# Patient Record
Sex: Male | Born: 1965 | Race: Black or African American | Hispanic: No | Marital: Married | Smoking: Never smoker
Health system: Southern US, Community
[De-identification: ages and names within clinical notes are randomized; demographics above are authoritative.]

## PROBLEM LIST (undated history)

## (undated) DIAGNOSIS — J45909 Unspecified asthma, uncomplicated: Secondary | ICD-10-CM

## (undated) DIAGNOSIS — K2970 Gastritis, unspecified, without bleeding: Secondary | ICD-10-CM

## (undated) DIAGNOSIS — C499 Malignant neoplasm of connective and soft tissue, unspecified: Secondary | ICD-10-CM

## (undated) DIAGNOSIS — E785 Hyperlipidemia, unspecified: Secondary | ICD-10-CM

## (undated) DIAGNOSIS — K219 Gastro-esophageal reflux disease without esophagitis: Secondary | ICD-10-CM

## (undated) DIAGNOSIS — E7800 Pure hypercholesterolemia, unspecified: Secondary | ICD-10-CM

## (undated) DIAGNOSIS — E669 Obesity, unspecified: Secondary | ICD-10-CM

## (undated) DIAGNOSIS — R000 Tachycardia, unspecified: Secondary | ICD-10-CM

## (undated) DIAGNOSIS — C801 Malignant (primary) neoplasm, unspecified: Secondary | ICD-10-CM

## (undated) DIAGNOSIS — G4730 Sleep apnea, unspecified: Secondary | ICD-10-CM

## (undated) HISTORY — PX: UVULOPALATOPHARYNGOPLASTY: SHX827

## (undated) HISTORY — DX: Hyperlipidemia, unspecified: E78.5

## (undated) HISTORY — DX: Obesity, unspecified: E66.9

## (undated) HISTORY — PX: TONSILLECTOMY AND ADENOIDECTOMY: SUR1326

## (undated) HISTORY — DX: Malignant neoplasm of connective and soft tissue, unspecified: C49.9

## (undated) HISTORY — DX: Tachycardia, unspecified: R00.0

## (undated) HISTORY — PX: SINUS EXPLORATION: SHX5214

## (undated) HISTORY — DX: Sleep apnea, unspecified: G47.30

## (undated) HISTORY — DX: Gastritis, unspecified, without bleeding: K29.70

---

## 2007-01-17 ENCOUNTER — Inpatient Hospital Stay: Payer: Self-pay | Admitting: Otolaryngology

## 2007-02-04 ENCOUNTER — Emergency Department: Payer: Self-pay | Admitting: Emergency Medicine

## 2009-01-31 ENCOUNTER — Ambulatory Visit: Payer: Self-pay | Admitting: Family Medicine

## 2010-07-13 ENCOUNTER — Ambulatory Visit: Payer: Self-pay | Admitting: Gastroenterology

## 2010-07-16 LAB — PATHOLOGY REPORT

## 2010-10-18 HISTORY — PX: ROTATOR CUFF REPAIR: SHX139

## 2011-02-11 ENCOUNTER — Ambulatory Visit: Payer: Self-pay | Admitting: Family Medicine

## 2011-07-06 ENCOUNTER — Ambulatory Visit: Payer: Self-pay | Admitting: Orthopedic Surgery

## 2011-08-30 ENCOUNTER — Ambulatory Visit: Payer: Self-pay | Admitting: Orthopedic Surgery

## 2011-10-19 HISTORY — PX: ESOPHAGOGASTRODUODENOSCOPY ENDOSCOPY: SHX5814

## 2014-12-18 ENCOUNTER — Ambulatory Visit: Payer: Self-pay | Admitting: Family Medicine

## 2015-04-24 ENCOUNTER — Encounter: Payer: Self-pay | Admitting: Family Medicine

## 2015-04-24 ENCOUNTER — Ambulatory Visit (INDEPENDENT_AMBULATORY_CARE_PROVIDER_SITE_OTHER): Payer: Managed Care, Other (non HMO) | Admitting: Family Medicine

## 2015-04-24 VITALS — BP 108/76 | HR 97 | Temp 98.2°F | Resp 18 | Ht 67.0 in | Wt 227.5 lb

## 2015-04-24 DIAGNOSIS — M5030 Other cervical disc degeneration, unspecified cervical region: Secondary | ICD-10-CM

## 2015-04-24 DIAGNOSIS — J4520 Mild intermittent asthma, uncomplicated: Secondary | ICD-10-CM

## 2015-04-24 NOTE — Patient Instructions (Signed)

## 2015-04-24 NOTE — Progress Notes (Signed)
Name: Dennis Donaldson   MRN: [id number]    DOB: 05-31-1966   Date:04/24/2015       Progress Note  Subjective  Chief Complaint  Chief Complaint  Patient presents with  . Neck Pain     follow up    Neck Pain  This is a recurrent problem. The current episode started more than 1 month ago. The problem occurs intermittently. The problem has been waxing and waning. The pain is associated with a sleep position. The pain is present in the left side. The quality of the pain is described as aching. The pain is mild. The symptoms are aggravated by twisting, position and stress. The pain is worse during the night. Stiffness is present at night. Associated symptoms include numbness and tingling. Pertinent negatives include no fever. He has tried NSAIDs and muscle relaxants for the symptoms. The treatment provided moderate relief.  Breathing Problem He complains of difficulty breathing, shortness of breath and wheezing. This is a recurrent problem. The current episode started more than 1 year ago. The problem occurs intermittently. The problem has been waxing and waning. Pertinent negatives include no fever. His symptoms are aggravated by change in weather, strenuous activity and occupational exposure. His symptoms are alleviated by steroid inhaler and beta-agonist. He reports moderate improvement on treatment. Risk factors for lung disease include occupational exposure. His past medical history is significant for asthma.      History reviewed. No pertinent past medical history.  History  Substance Use Topics  . Smoking status: Never Smoker   . Smokeless tobacco: Never Used  . Alcohol Use: No     Current outpatient prescriptions:  .  albuterol (PROAIR HFA) 108 (90 BASE) MCG/ACT inhaler, Inhale into the lungs., Disp: , Rfl:  .  cyclobenzaprine (FLEXERIL) 5 MG tablet, Take 5 mg by mouth 2 (two) times daily., Disp: , Rfl:  .  meloxicam (MOBIC) 15 MG tablet, Take 15 mg by mouth daily., Disp: , Rfl:  1  Allergies  Allergen Reactions  . Erythromycin     Other reaction(s): Other (See Comments) Other Reaction: GI Upset    Review of Systems  Constitutional: Negative for fever and chills.  Respiratory: Positive for shortness of breath and wheezing.   Cardiovascular: Negative.   Musculoskeletal: Positive for joint pain and neck pain.  Neurological: Positive for tingling and numbness.     Objective  Filed Vitals:   04/24/15 0847  BP: 108/76  Pulse: 97  Temp: 98.2 F (36.8 C)  Resp: 18  Height: 5\' 7"  (1.702 m)  Weight: 227 lb 8 oz (103.193 kg)  SpO2: 98%     Physical Exam  Constitutional: He is oriented to person, place, and time and well-developed, well-nourished, and in no distress.  HENT:  Head: Normocephalic.  Eyes: EOM are normal. Pupils are equal, round, and reactive to light.  Neck: Neck supple. No thyromegaly present.  Lateral rotation of the C-spine limited to about 75. There is also some tenderness in the sternocleidomastoid as well as trapezius area.  Cardiovascular: Normal rate, regular rhythm and normal heart sounds.   No murmur heard. Pulmonary/Chest: Effort normal and breath sounds normal. No respiratory distress. He has no wheezes.  Abdominal: Soft. Bowel sounds are normal.  Musculoskeletal: Normal range of motion. He exhibits no edema.  Lymphadenopathy:    He has no cervical adenopathy.  Neurological: He is alert and oriented to person, place, and time. No cranial nerve deficit. Gait normal. Coordination normal.  Skin: Skin is warm  and dry. No rash noted.  Psychiatric: Affect and judgment normal.      Assessment & Plan   1. Degenerative disc disease, cervical Modestly improved stable - meloxicam (MOBIC) 15 MG tablet; Take 15 mg by mouth daily.; Refill: 1 - cyclobenzaprine (FLEXERIL) 5 MG tablet; Take 5 mg by mouth 2 (two) times daily.  2. Asthma, mild intermittent, uncomplicated Stable - albuterol (PROAIR HFA) 108 (90 BASE) MCG/ACT  inhaler; Inhale into the lungs.

## 2015-07-01 ENCOUNTER — Encounter: Payer: Self-pay | Admitting: Family Medicine

## 2015-07-01 ENCOUNTER — Ambulatory Visit (INDEPENDENT_AMBULATORY_CARE_PROVIDER_SITE_OTHER): Payer: Managed Care, Other (non HMO) | Admitting: Family Medicine

## 2015-07-01 VITALS — BP 118/78 | HR 100 | Temp 98.4°F | Resp 16 | Wt 225.6 lb

## 2015-07-01 DIAGNOSIS — J4520 Mild intermittent asthma, uncomplicated: Secondary | ICD-10-CM

## 2015-07-01 DIAGNOSIS — K219 Gastro-esophageal reflux disease without esophagitis: Secondary | ICD-10-CM | POA: Diagnosis not present

## 2015-07-01 DIAGNOSIS — E669 Obesity, unspecified: Secondary | ICD-10-CM | POA: Diagnosis not present

## 2015-07-01 DIAGNOSIS — Z23 Encounter for immunization: Secondary | ICD-10-CM

## 2015-07-01 DIAGNOSIS — K429 Umbilical hernia without obstruction or gangrene: Secondary | ICD-10-CM

## 2015-07-01 DIAGNOSIS — M5030 Other cervical disc degeneration, unspecified cervical region: Secondary | ICD-10-CM | POA: Diagnosis not present

## 2015-07-01 DIAGNOSIS — J45909 Unspecified asthma, uncomplicated: Secondary | ICD-10-CM

## 2015-07-01 MED ORDER — MELOXICAM 15 MG PO TABS: 15.0000 mg | ORAL_TABLET | Freq: Every day | ORAL | Status: DC

## 2015-07-01 MED ORDER — ALBUTEROL SULFATE HFA 108 (90 BASE) MCG/ACT IN AERS: 2.0000 | INHALATION_SPRAY | Freq: Four times a day (QID) | RESPIRATORY_TRACT | Status: AC | PRN

## 2015-07-01 MED ORDER — OMEPRAZOLE 20 MG PO CPDR: 20.0000 mg | DELAYED_RELEASE_CAPSULE | Freq: Every day | ORAL | Status: DC

## 2015-07-01 NOTE — Progress Notes (Signed)
Name: Dennis Donaldson   MRN: [id number]    DOB: 08-Feb-1966   Date:07/01/2015       Progress Note  Subjective  Chief Complaint  Chief Complaint  Patient presents with  . Neck Pain    pt here for med refills    Neck Pain  Pertinent negatives include no chest pain, fever, headaches, tingling, weakness or weight loss.  Asthma He complains of cough and wheezing. There is no hemoptysis or shortness of breath. Associated symptoms include heartburn. Pertinent negatives include no chest pain, fever, headaches, myalgias, sore throat or weight loss. His past medical history is significant for asthma.  Gastrophageal Reflux He complains of coughing, heartburn and wheezing. He reports no chest pain, no nausea or no sore throat. Pertinent negatives include no weight loss.    Patient has a history of asthma dating back for over 10 years. He has intermittent difficulty with breathing and wheezing and minimal shortness of breath. This has been a chronic and recurrent problem. The onset was many years ago. It occurs intermittently intermittently. Progression has been gradually improving. Associated symptoms include postnasal drip occasionally is aggravated by weather changes and pollen exposure. Treatment consist of Qvar 80 twice a day and pro-air HFA when necessary . Lung disease risk include asthma only . Past medical history is positive for asthma and  nothing nothing .      Obesity  Patient has a history of obesity for several years.  Attempts at weight loss have included diet and exercise and limited to diet compliance .  Results of this regimen  have been relatively successful .  Patient now voices and interest in weight loss by  diet next size  Gastroesophageal reflux history of present illness  Patient has a history of reflux for number of years. Primary symptoms include belching dysphagia heartburn. It has been recurrent over the years. Onset was more than a year ago. It recurs occasionally.  Progression since onset has been gradually worsening. It is aggravated by caffeine and certain foods and tight clothing and weight gain. Associated symptoms include heartburn.  Risk factors include caffeine usage back of exercise obesity and occasional NSAID usage. Treatment tried includes diet change and over-the-counter antacids. This been mild relief with this. There've been no procedures no invasive treatments  Umbilical hernia  Patient complains of pain and discomfort in the umbilical area with some bulging. This is been a recurrent problem and is worse with weight gain and with straining.   Social History  Substance Use Topics  . Smoking status: Never Smoker   . Smokeless tobacco: Never Used  . Alcohol Use: No     Current outpatient prescriptions:  .  albuterol (PROAIR HFA) 108 (90 BASE) MCG/ACT inhaler, Inhale 2 puffs into the lungs every 6 (six) hours as needed for wheezing or shortness of breath., Disp: 1 Inhaler, Rfl: 3 .  meloxicam (MOBIC) 15 MG tablet, Take 1 tablet (15 mg total) by mouth daily., Disp: 90 tablet, Rfl: 1 .  cyclobenzaprine (FLEXERIL) 5 MG tablet, Take 5 mg by mouth 2 (two) times daily., Disp: , Rfl:  .  omeprazole (PRILOSEC) 20 MG capsule, Take 1 capsule (20 mg total) by mouth daily., Disp: 90 capsule, Rfl: 1  Allergies  Allergen Reactions  . Erythromycin     Other reaction(s): Other (See Comments) Other Reaction: GI Upset    Review of Systems  Constitutional: Negative for fever, chills and weight loss.       Modest obese in no  acute distress  HENT: Negative for congestion, hearing loss, sore throat and tinnitus.   Eyes: Negative for blurred vision, double vision and redness.  Respiratory: Positive for cough and wheezing. Negative for hemoptysis and shortness of breath.   Cardiovascular: Negative for chest pain, palpitations, orthopnea, claudication and leg swelling.  Gastrointestinal: Positive for heartburn. Negative for nausea, vomiting, diarrhea,  constipation and blood in stool.  Genitourinary: Negative for dysuria, urgency, frequency and hematuria.  Musculoskeletal: Positive for neck pain. Negative for myalgias, back pain, joint pain and falls.  Skin: Negative for itching.  Neurological: Negative for dizziness, tingling, tremors, focal weakness, seizures, loss of consciousness, weakness and headaches.  Endo/Heme/Allergies: Does not bruise/bleed easily.  Psychiatric/Behavioral: Negative for depression and substance abuse. The patient is not nervous/anxious and does not have insomnia.      Objective  Filed Vitals:   07/01/15 0912  BP: 118/78  Pulse: 100  Temp: 98.4 F (36.9 C)  Resp: 16  Weight: 225 lb 9 oz (102.314 kg)  SpO2: 98%     Physical Exam  Constitutional: He is oriented to person, place, and time and well-developed, well-nourished, and in no distress.  Modestly obese in no acute distress  HENT:  Head: Normocephalic.  Eyes: EOM are normal. Pupils are equal, round, and reactive to light.  Neck: Normal range of motion. Neck supple. No thyromegaly present.  Cardiovascular: Normal rate, regular rhythm and normal heart sounds.   No murmur heard. Pulmonary/Chest: Effort normal and breath sounds normal. No respiratory distress. He has no wheezes.  Abdominal: Soft. Bowel sounds are normal. There is tenderness.  Obese  Umbilical hernia is palpable with tenderness but is freely reducible  Musculoskeletal: Normal range of motion. He exhibits no edema.  Lymphadenopathy:    He has no cervical adenopathy.  Neurological: He is alert and oriented to person, place, and time. No cranial nerve deficit. Gait normal. Coordination normal.  Skin: Skin is warm and dry. No rash noted.  Psychiatric: Mood, affect and judgment normal.      Assessment & Plan  1. Asthma, unspecified asthma severity, uncomplicated Stable  2. Obesity Handout given  3. Gastroesophageal reflux disease, esophagitis presence not  specified Omeprazole - omeprazole (PRILOSEC) 20 MG capsule; Take 1 capsule (20 mg total) by mouth daily.  Dispense: 90 capsule; Refill: 1  4. Umbilical hernia, recurrence not specified Conservative management for now but will eventually need to consider elective surgery  5. Need for influenza vaccination Given - Flu Vaccine QUAD 36+ mos PF IM (Fluarix & Fluzone Quad PF)  6. Need for pneumococcal vaccination Given - Pneumococcal conjugate vaccine 13-valent  7. Degenerative disc disease, cervical Continue meloxicam - meloxicam (MOBIC) 15 MG tablet; Take 1 tablet (15 mg total) by mouth daily.  Dispense: 90 tablet; Refill: 1  8. Asthma, mild intermittent, uncomplicated Continue Qvar daily and albuterol when necessary - albuterol (PROAIR HFA) 108 (90 BASE) MCG/ACT inhaler; Inhale 2 puffs into the lungs every 6 (six) hours as needed for wheezing or shortness of breath.  Dispense: 1 Inhaler; Refill: 3

## 2015-07-01 NOTE — Patient Instructions (Signed)

## 2015-07-02 ENCOUNTER — Other Ambulatory Visit: Payer: Self-pay | Admitting: Emergency Medicine

## 2015-07-03 ENCOUNTER — Telehealth: Payer: Self-pay | Admitting: Family Medicine

## 2015-07-03 MED ORDER — BECLOMETHASONE DIPROPIONATE 80 MCG/ACT IN AERS: 2.0000 | INHALATION_SPRAY | Freq: Four times a day (QID) | RESPIRATORY_TRACT | Status: DC

## 2015-07-03 MED ORDER — BECLOMETHASONE DIPROPIONATE 80 MCG/ACT IN AERS: 2.0000 | INHALATION_SPRAY | Freq: Two times a day (BID) | RESPIRATORY_TRACT | Status: DC

## 2015-07-03 NOTE — Telephone Encounter (Signed)
Spoke with [facility] and new rx has been sent

## 2015-07-03 NOTE — Telephone Encounter (Signed)
Kasey from [facility] requesting clarification on the dosage of Qvar. States that the dosage given is only allowing the patient a 30 day supply verses 90 day supply.  [phone]

## 2015-07-04 ENCOUNTER — Other Ambulatory Visit: Payer: Self-pay | Admitting: Emergency Medicine

## 2015-07-04 MED ORDER — BECLOMETHASONE DIPROPIONATE 80 MCG/ACT IN AERS: 2.0000 | INHALATION_SPRAY | Freq: Two times a day (BID) | RESPIRATORY_TRACT | Status: AC

## 2015-07-10 ENCOUNTER — Encounter: Payer: Self-pay | Admitting: Family Medicine

## 2015-07-10 ENCOUNTER — Ambulatory Visit (INDEPENDENT_AMBULATORY_CARE_PROVIDER_SITE_OTHER): Payer: Managed Care, Other (non HMO) | Admitting: Family Medicine

## 2015-07-10 ENCOUNTER — Other Ambulatory Visit: Payer: Self-pay

## 2015-07-10 ENCOUNTER — Ambulatory Visit: Payer: Managed Care, Other (non HMO) | Admitting: Family Medicine

## 2015-07-10 ENCOUNTER — Emergency Department: Payer: Managed Care, Other (non HMO)

## 2015-07-10 ENCOUNTER — Encounter: Payer: Self-pay | Admitting: Emergency Medicine

## 2015-07-10 ENCOUNTER — Emergency Department
Admission: EM | Admit: 2015-07-10 | Discharge: 2015-07-10 | Disposition: A | Discharge status: Home / Self Care | Payer: Managed Care, Other (non HMO) | Attending: Emergency Medicine | Admitting: Emergency Medicine

## 2015-07-10 VITALS — BP 118/84 | HR 100 | Temp 98.6°F | Resp 16 | Ht 67.0 in | Wt 226.1 lb

## 2015-07-10 DIAGNOSIS — R079 Chest pain, unspecified: Secondary | ICD-10-CM

## 2015-07-10 DIAGNOSIS — Z791 Long term (current) use of non-steroidal anti-inflammatories (NSAID): Secondary | ICD-10-CM | POA: Insufficient documentation

## 2015-07-10 DIAGNOSIS — Z7951 Long term (current) use of inhaled steroids: Secondary | ICD-10-CM | POA: Diagnosis not present

## 2015-07-10 DIAGNOSIS — Z79899 Other long term (current) drug therapy: Secondary | ICD-10-CM | POA: Diagnosis not present

## 2015-07-10 DIAGNOSIS — R42 Dizziness and giddiness: Secondary | ICD-10-CM | POA: Diagnosis not present

## 2015-07-10 HISTORY — DX: Pure hypercholesterolemia, unspecified: E78.00

## 2015-07-10 HISTORY — DX: Gastro-esophageal reflux disease without esophagitis: K21.9

## 2015-07-10 HISTORY — DX: Malignant (primary) neoplasm, unspecified: C80.1

## 2015-07-10 HISTORY — DX: Unspecified asthma, uncomplicated: J45.909

## 2015-07-10 LAB — CBC
HEMATOCRIT: 47.9 % (ref 40.0–52.0)
HEMOGLOBIN: 15.8 g/dL (ref 13.0–18.0)
MCH: 29.9 pg (ref 26.0–34.0)
MCHC: 33.1 g/dL (ref 32.0–36.0)
MCV: 90.5 fL (ref 80.0–100.0)
Platelets: 154 10*3/uL (ref 150–440)
RBC: 5.29 MIL/uL (ref 4.40–5.90)
RDW: 13.7 % (ref 11.5–14.5)
WBC: 4.9 10*3/uL (ref 3.8–10.6)

## 2015-07-10 LAB — BASIC METABOLIC PANEL
ANION GAP: 9 (ref 5–15)
BUN: 16 mg/dL (ref 6–20)
CALCIUM: 9.6 mg/dL (ref 8.9–10.3)
CO2: 28 mmol/L (ref 22–32)
Chloride: 103 mmol/L (ref 101–111)
Creatinine, Ser: 1.29 mg/dL — ABNORMAL HIGH (ref 0.61–1.24)
GFR calc Af Amer: >60 mL/min (ref >60)
GFR calc non Af Amer: >60 mL/min (ref >60)
GLUCOSE: 91 mg/dL (ref 65–99)
POTASSIUM: 4.2 mmol/L (ref 3.5–5.1)
Sodium: 140 mmol/L (ref 135–145)

## 2015-07-10 LAB — TROPONIN I: Troponin I: 0.03 ng/mL (ref <0.031)

## 2015-07-10 NOTE — ED Notes (Signed)
Chest pain for 3 days that comes and goes.  Says it was not that bad this am, but it seems like it gets worse through the dayl.

## 2015-07-10 NOTE — ED Provider Notes (Signed)
[facility] Emergency Department Provider Note  Time seen: 10:36 AM  I have reviewed the triage vital signs and the nursing notes.   HISTORY  Chief Complaint Chest Pain and Dizziness    HPI Dennis Donaldson is a 49 y.o. male with a past medical history of asthma, gastric reflux, hyperlipidemia, osteosarcoma in remission, who presents to the emergency department with intermittent chest pain. According to the patient for the past 3-4 days he has had intermittent chest pains. He states they are mild in the morning was somewhat worse and as the day progresses. Denies any acute correlation with exertion, but states the longer he is up and about the worst the pain gets. Denies any resolution of pain with rest. Denies any nausea or trouble breathing. Patient has no cardiac history himself, or any direct family cardiac history. Describes his chest pain currently is mild, dull sensation throughout his entire chest.    Past Medical History  Diagnosis Date  . Asthma   . GERD (gastroesophageal reflux disease)   . Cancer   . Hypercholesteremia     Patient Active Problem List   Diagnosis Date Noted  . Chest pain, rule out acute myocardial infarction 07/10/2015  . Chest pain 07/10/2015    Past Surgical History  Procedure Laterality Date  . Rotator cuff repair Left 2012  . Uvulopalatopharyngoplasty    . Tonsillectomy and adenoidectomy    . Sinus exploration      Current Outpatient Rx  Name  Route  Sig  Dispense  Refill  . albuterol (PROAIR HFA) 108 (90 BASE) MCG/ACT inhaler   Inhalation   Inhale 2 puffs into the lungs every 6 (six) hours as needed for wheezing or shortness of breath.   1 Inhaler   3   . beclomethasone (QVAR) 80 MCG/ACT inhaler   Inhalation   Inhale 2 puffs into the lungs 2 (two) times daily.   3 Inhaler   1   . cyclobenzaprine (FLEXERIL) 5 MG tablet   Oral   Take 5 mg by mouth 2 (two) times daily.         . meloxicam (MOBIC) 15 MG  tablet   Oral   Take 1 tablet (15 mg total) by mouth daily.   90 tablet   1   . omeprazole (PRILOSEC) 20 MG capsule   Oral   Take 1 capsule (20 mg total) by mouth daily. Patient not taking: Reported on 07/10/2015   90 capsule   1     Allergies Erythromycin  Family History  Problem Relation Age of Onset  . Thyroid disease Mother   . Hypertension Mother   . Diabetes Maternal Grandmother   . Diabetes Maternal Grandfather     Social History Social History  Substance Use Topics  . Smoking status: Never Smoker   . Smokeless tobacco: Never Used  . Alcohol Use: No    Review of Systems Constitutional: Negative for fever Cardiovascular: Positive for chest pain. Respiratory: Negative for shortness of breath. Gastrointestinal: Negative for abdominal pain, vomiting and diarrhea. Neurological: Negative for headache 10-point ROS otherwise negative.  ____________________________________________   PHYSICAL EXAM:  VITAL SIGNS: ED Triage Vitals  Enc Vitals Group     BP 07/10/15 0959 136/90 mmHg     Pulse Rate 07/10/15 0959 85     Resp 07/10/15 0959 18     Temp 07/10/15 0959 98.7 F (37.1 C)     Temp Source 07/10/15 0959 Oral     SpO2 07/10/15  0959 99 %     Weight 07/10/15 0953 226 lb (102.513 kg)     Height 07/10/15 0953 5\' 6"  (1.676 m)     Head Cir --      Peak Flow --      Pain Score 07/10/15 0953 4     Pain Loc --      Pain Edu? --      Excl. in [place]? --     Constitutional: Alert and oriented. Well appearing and in no distress. Eyes: Normal exam ENT   Head: Normocephalic and atraumatic. Cardiovascular: Normal rate, regular rhythm. No murmur Respiratory: Normal respiratory effort without tachypnea nor retractions. Breath sounds are clear and equal bilaterally. No wheezes/rales/rhonchi. Gastrointestinal: Soft and nontender. No distention.   Musculoskeletal: Nontender with normal range of motion in all extremities. No lower extremity tenderness or  edema. Neurologic:  Normal speech and language. No gross focal neurologic deficits  Psychiatric: Mood and affect are normal. Speech and behavior are normal. Patient exhibits appropriate insight and judgment.  ____________________________________________    EKG  EKG reviewed and interpreted by myself shows normal sinus rhythm at 80 bpm, narrow QRS, normal axis, normal intervals, no ST changes present., RSR pattern present, consistent with incomplete right bundle branch block.  ____________________________________________    RADIOLOGY  Chest x-ray shows no acute abnormalities.  ____________________________________________   INITIAL IMPRESSION / ASSESSMENT AND PLAN / ED COURSE  Pertinent labs & imaging results that were available during my care of the patient were reviewed by me and considered in my medical decision making (see chart for details).  Patient with intermittent chest pain for the past 3-4 days. Currently states mild discomfort. We will check labs. Chest x-ray and EKG both appear normal. Labs within normal limits. Troponin is negative. We will refer the patient cardiology for stress test. I discussed this with the patient as well as my normal chest pain return precautions who is agreeable to plan.  ____________________________________________   FINAL CLINICAL IMPRESSION(S) / ED DIAGNOSES  Chest pain   Harvest Dark, MD 07/10/15 1324

## 2015-07-10 NOTE — Discharge Instructions (Signed)

## 2015-07-10 NOTE — Progress Notes (Signed)
Name: Dennis Donaldson   MRN: [id number]    DOB: 30-May-1966   Date:07/10/2015       Progress Note  Subjective  Chief Complaint  Chief Complaint  Patient presents with  . Chest Pain    pt has been having chest tightnees and pain for 7 days but also started medication and believes it may be related  . Dizziness    Chest Pain  This is a new problem. The current episode started in the past 7 days. The problem occurs intermittently. The pain is present in the substernal region. The pain is at a severity of 4/10. The pain is mild. The quality of the pain is described as pressure. The pain does not radiate. Associated symptoms include irregular heartbeat (pt. describes symptoms of 'flutter' in his chest) and near-syncope (experiencing dizziness intermittently). Pertinent negatives include no cough, fever, leg pain, malaise/fatigue, nausea, palpitations, shortness of breath or vomiting.  Pertinent negatives for past medical history include no MI, no PE and no strokes.  Pertinent negatives for family medical history include: no heart disease.  patient was recently started on omeprazole for reflux and thinks that the chest pain may be related to omeprazole, which he has since discontinued.  No past medical history on file.  Past Surgical History  Procedure Laterality Date  . Rotator cuff repair Left 2012  . Uvulopalatopharyngoplasty    . Tonsillectomy and adenoidectomy    . Sinus exploration      Family History  Problem Relation Age of Onset  . Thyroid disease Mother   . Hypertension Mother   . Diabetes Maternal Grandmother   . Diabetes Maternal Grandfather     Social History   Social History  . Marital Status: Married    Spouse Name: N/A  . Number of Children: N/A  . Years of Education: N/A   Occupational History  . Not on file.   Social History Main Topics  . Smoking status: Never Smoker   . Smokeless tobacco: Never Used  . Alcohol Use: No  . Drug Use: No  . Sexual Activity:     Partners: Female   Other Topics Concern  . Not on file   Social History Narrative     Current outpatient prescriptions:  .  albuterol (PROAIR HFA) 108 (90 BASE) MCG/ACT inhaler, Inhale 2 puffs into the lungs every 6 (six) hours as needed for wheezing or shortness of breath., Disp: 1 Inhaler, Rfl: 3 .  beclomethasone (QVAR) 80 MCG/ACT inhaler, Inhale 2 puffs into the lungs 2 (two) times daily., Disp: 3 Inhaler, Rfl: 1 .  cyclobenzaprine (FLEXERIL) 5 MG tablet, Take 5 mg by mouth 2 (two) times daily., Disp: , Rfl:  .  meloxicam (MOBIC) 15 MG tablet, Take 1 tablet (15 mg total) by mouth daily., Disp: 90 tablet, Rfl: 1 .  omeprazole (PRILOSEC) 20 MG capsule, Take 1 capsule (20 mg total) by mouth daily. (Patient not taking: Reported on 07/10/2015), Disp: 90 capsule, Rfl: 1  Allergies  Allergen Reactions  . Erythromycin     Other reaction(s): Other (See Comments) Other Reaction: GI Upset   Review of Systems  Constitutional: Negative for fever and malaise/fatigue.  Respiratory: Negative for cough and shortness of breath.   Cardiovascular: Positive for near-syncope (experiencing dizziness intermittently). Negative for palpitations.  Gastrointestinal: Negative for nausea and vomiting.   Objective  Filed Vitals:   07/10/15 0858  BP: 118/84  Pulse: 100  Temp: 98.6 F (37 C)  Resp: 16  Height: 5\' 7"  (  1.702 m)  Weight: 226 lb 1 oz (102.541 kg)  SpO2: 96%    Physical Exam  Constitutional: He is oriented to person, place, and time and well-developed, well-nourished, and in no distress.  HENT:  Head: Normocephalic and atraumatic.  Cardiovascular: Tachycardia present.   Pulmonary/Chest: Breath sounds normal.  Neurological: He is alert and oriented to person, place, and time.  Skin: Skin is warm and dry.  Nursing note and vitals reviewed.   Assessment & Plan  1. Chest pain, unspecified chest pain type  Referred patient to [facility] ER for evaluation of recurrent chest pain. Plan  discussed with patient and  Informed charge nurse at the ER of patient's arrival.  [name] A. [facility] 07/10/2015 9:16 AM

## 2015-07-10 NOTE — ED Notes (Signed)
MD at bedside. [email or web address]

## 2015-07-14 ENCOUNTER — Ambulatory Visit (INDEPENDENT_AMBULATORY_CARE_PROVIDER_SITE_OTHER): Payer: Managed Care, Other (non HMO) | Admitting: Cardiovascular Disease

## 2015-07-14 ENCOUNTER — Encounter: Payer: Self-pay | Admitting: Cardiovascular Disease

## 2015-07-14 VITALS — BP 122/88 | HR 105 | Ht 66.5 in | Wt 224.5 lb

## 2015-07-14 DIAGNOSIS — E785 Hyperlipidemia, unspecified: Secondary | ICD-10-CM

## 2015-07-14 DIAGNOSIS — R079 Chest pain, unspecified: Secondary | ICD-10-CM

## 2015-07-14 DIAGNOSIS — G4730 Sleep apnea, unspecified: Secondary | ICD-10-CM

## 2015-07-14 DIAGNOSIS — E669 Obesity, unspecified: Secondary | ICD-10-CM

## 2015-07-14 DIAGNOSIS — C499 Malignant neoplasm of connective and soft tissue, unspecified: Secondary | ICD-10-CM

## 2015-07-14 DIAGNOSIS — I471 Supraventricular tachycardia: Secondary | ICD-10-CM | POA: Diagnosis not present

## 2015-07-14 DIAGNOSIS — R000 Tachycardia, unspecified: Secondary | ICD-10-CM

## 2015-07-14 HISTORY — DX: Obesity, unspecified: E66.9

## 2015-07-14 HISTORY — DX: Malignant neoplasm of connective and soft tissue, unspecified: C49.9

## 2015-07-14 HISTORY — DX: Tachycardia, unspecified: R00.0

## 2015-07-14 HISTORY — DX: Sleep apnea, unspecified: G47.30

## 2015-07-14 HISTORY — DX: Hyperlipidemia, unspecified: E78.5

## 2015-07-14 NOTE — Assessment & Plan Note (Signed)
He had phase I/phase II surgery, reports having no apnea but does have snoring Likely unrelated to his present symptoms

## 2015-07-14 NOTE — Assessment & Plan Note (Signed)
Cutaneous lesion treated on his left hand in 2003. No recurrence

## 2015-07-14 NOTE — Patient Instructions (Addendum)
You are doing well. No medication changes were made.  We will schedule a treadmill test for chest pain  If chest pain gets worse, we could schedule an echo  Monitor your heart rate  Please call us if you have new issues that need to be addressed before your next appt.    Exercise Stress Electrocardiogram An exercise stress electrocardiogram is a test that is done to evaluate the blood supply to your heart. This test may also be called exercise stress electrocardiography. The test is done while you are walking on a treadmill. The goal of this test is to raise your heart rate. This test is done to find areas of poor blood flow to the heart by determining the extent of coronary artery disease (CAD).   CAD is defined as narrowing in one or more heart (coronary) arteries of more than 70%. If you have an abnormal test result, this may mean that you are not getting adequate blood flow to your heart during exercise. Additional testing may be needed to understand why your test was abnormal. LET [facility] CARE Dennis Donaldson KNOW ABOUT:   Any allergies you have.  All medicines you are taking, including vitamins, herbs, eye drops, creams, and over-the-counter medicines.  Previous problems you or members of your family have had with the use of anesthetics.  Any blood disorders you have.  Previous surgeries you have had.  Medical conditions you have.  Possibility of pregnancy, if this applies. RISKS AND COMPLICATIONS Generally, this is a safe procedure. However, as with any procedure, complications can occur. Possible complications can include:  Pain or pressure in the following areas:  Chest.  Jaw or neck.  Between your shoulder blades.  Radiating down your left arm.  Dizziness or light-headedness.  Shortness of breath.  Increased or irregular heartbeats.  Nausea or vomiting.  Heart attack (rare). BEFORE THE PROCEDURE  Avoid all forms of caffeine 24 hours before your test or as  directed by your health care Dennis Donaldson. This includes coffee, tea (even decaffeinated tea), caffeinated sodas, chocolate, cocoa, and certain pain medicines.  Follow your health care Dennis Donaldson's instructions regarding eating and drinking before the test.  Take your medicines as directed at regular times with water unless instructed otherwise. Exceptions may include:  If you have diabetes, ask how you are to take your insulin or pills. It is common to adjust insulin dosing the morning of the test.  If you are taking beta-blocker medicines, it is important to talk to your health care Dennis Donaldson about these medicines well before the date of your test. Taking beta-blocker medicines may interfere with the test. In some cases, these medicines need to be changed or stopped 24 hours or more before the test.  If you wear a nitroglycerin patch, it may need to be removed prior to the test. Ask your health care Dennis Donaldson if the patch should be removed before the test.  If you use an inhaler for any breathing condition, bring it with you to the test.  If you are an outpatient, bring a snack so you can eat right after the stress phase of the test.  Do not smoke for 4 hours prior to the test or as directed by your health care Dennis Donaldson.  Do not apply lotions, powders, creams, or oils on your chest prior to the test.  Wear loose-fitting clothes and comfortable shoes for the test. This test involves walking on a treadmill. PROCEDURE  Multiple patches (electrodes) will be put on your chest.  If needed, small areas of your chest may have to be shaved to get better contact with the electrodes. Once the electrodes are attached to your body, multiple wires will be attached to the electrodes and your heart rate will be monitored.  Your heart will be monitored both at rest and while exercising.  You will walk on a treadmill. The treadmill will be started at a slow pace. The treadmill speed and incline will gradually be  increased to raise your heart rate. AFTER THE PROCEDURE  Your heart rate and blood pressure will be monitored after the test.  You may return to your normal schedule including diet, activities, and medicines, unless your health care Dennis Donaldson tells you otherwise. Document Released: 10/01/2000 Document Revised: 10/09/2013 Document Reviewed: 06/11/2013 [facility] Patient Information 2015 [place], [place]. This information is not intended to replace advice given to you by your health care Dennis Donaldson. Make sure you discuss any questions you have with your health care Dennis Donaldson.

## 2015-07-14 NOTE — Assessment & Plan Note (Signed)
Etiology of his sinus tachycardia is unclear. Prior EKG and Dr. visits showing heart rate in the 80s. Unable to exclude anxiety We'll monitor for now

## 2015-07-14 NOTE — Assessment & Plan Note (Signed)
Currently not on a cholesterol medication. He is trying to lose weight, changes diet

## 2015-07-14 NOTE — Progress Notes (Signed)
Patient ID: Dennis Donaldson, male    DOB: January 26, 1966, 49 y.o.   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 He is a long history of GERD.  He reports that he recently started omeprazole, one week later developed difficulty when he swallowed like something was stuck in his mediastinal area. Symptoms persisted for many days. Symptoms were present at rest and stress, no change on exertion. Did not seem to change with position. He stopped his omeprazole, GERD is now back. Chest pressure in his mediastinum still persists but very mild.  He is otherwise active. No complaints at baseline No prior smoking history. No family history of coronary artery disease Had a treadmill several years ago for chest pain symptoms which was normal. Details not available. He feels this was done at [place]. Since he developed chest discomfort, he has been monitoring blood pressure and heart rate. Heart rate has been running high at 100 sometimes 115 bpm  EKG on today's visit shows sinus tachycardia with rate 105 bpm   Allergies  Allergen Reactions  . Erythromycin     Other reaction(s): Other (See Comments) Other Reaction: GI Upset    Current Outpatient Prescriptions on File Prior to Visit  Medication Sig Dispense Refill  . albuterol (PROAIR HFA) 108 (90 BASE) MCG/ACT inhaler Inhale 2 puffs into the lungs every 6 (six) hours as needed for wheezing or shortness of breath. 1 Inhaler 3  . beclomethasone (QVAR) 80 MCG/ACT inhaler Inhale 2 puffs into the lungs 2 (two) times daily. 3 Inhaler 1  . cyclobenzaprine (FLEXERIL) 5 MG tablet Take 5 mg by mouth 2 (two) times daily.    . meloxicam (MOBIC) 15 MG tablet Take 1 tablet (15 mg total) by mouth daily. 90 tablet 1  . omeprazole (PRILOSEC) 20  MG capsule Take 1 capsule (20 mg total) by mouth daily. 90 capsule 1   No current facility-administered medications on file prior to visit.    Past Medical History  Diagnosis Date  . Asthma   . GERD (gastroesophageal reflux disease)   . Cancer   . Hypercholesteremia     Past Surgical History  Procedure Laterality Date  . Rotator cuff repair Left 2012  . Uvulopalatopharyngoplasty    . Tonsillectomy and adenoidectomy    . Sinus exploration      Social History  reports that he has never smoked. He has never used smokeless tobacco. He reports that he does not drink alcohol or use illicit drugs.  Family History family history includes Diabetes in his maternal grandfather and maternal grandmother; Hypertension in his mother; Thyroid disease in his mother.   Review of Systems  Constitutional: Negative.   HENT: Negative.   Respiratory: Negative.   Cardiovascular: Positive for chest pain.       Tachycardia  Gastrointestinal: Negative.   Musculoskeletal: Negative.   Neurological: Negative.   Hematological: Negative.   Psychiatric/Behavioral: Negative.   All other systems reviewed and are negative.   BP 122/88 mmHg  Pulse 105  Ht 5' 6.5" (1.689 m)  Wt 224 lb 8 oz (101.833 kg)  BMI 35.70 kg/m2  Physical Exam  Constitutional: He is oriented to person, place, and time. He appears well-developed and well-nourished.  HENT:  Head: Normocephalic.  Nose: Nose normal.  Mouth/Throat: Oropharynx is clear and moist.  Eyes: Conjunctivae are normal. Pupils are equal, round, and reactive to light.  Neck: Normal range of motion. Neck supple. No JVD present.  Cardiovascular: Normal rate, regular rhythm, normal heart sounds and intact distal pulses.  Exam reveals no gallop and no friction rub.   No murmur heard. Pulmonary/Chest: Effort normal and breath sounds normal. No respiratory distress. He has no wheezes. He has no rales. He exhibits no tenderness.  Abdominal: Soft. Bowel sounds  are normal. He exhibits no distension. There is no tenderness.  Musculoskeletal: Normal range of motion. He exhibits no edema or tenderness.  Lymphadenopathy:    He has no cervical adenopathy.  Neurological: He is alert and oriented to person, place, and time. Coordination normal.  Skin: Skin is warm and dry. No rash noted. No erythema.  Psychiatric: He has a normal mood and affect. His behavior is normal. Judgment and thought content normal.

## 2015-07-14 NOTE — Assessment & Plan Note (Addendum)
Atypical chest pain symptoms Less likely musculoskeletal, unable to exclude GI etiology. We'll start with routine treadmill study If this shows no EKG changes concerning for ischemia and if he has recurrent symptoms, could order echocardiogram to rule out structural heart disease. This was discussed with him. If he would like echocardiogram, this can be scheduled at any time

## 2015-07-14 NOTE — Assessment & Plan Note (Signed)
We have encouraged continued exercise, careful diet management in an effort to lose weight. 

## 2015-07-31 ENCOUNTER — Ambulatory Visit (INDEPENDENT_AMBULATORY_CARE_PROVIDER_SITE_OTHER): Payer: Managed Care, Other (non HMO)

## 2015-07-31 DIAGNOSIS — R079 Chest pain, unspecified: Secondary | ICD-10-CM | POA: Diagnosis not present

## 2015-08-01 LAB — EXERCISE TOLERANCE TEST
Estimated workload: 9.3 METS
Exercise duration (min): 7 min
Exercise duration (sec): 24 s
MPHR: 172 {beats}/min
Peak HR: 153 {beats}/min
Percent HR: 88 %
Percent of predicted max HR: 88 %
Rest HR: 93 {beats}/min
Stage 1 DBP: 96 mmHg
Stage 1 Grade: 0 %
Stage 1 HR: 96 {beats}/min
Stage 1 SBP: 169 mmHg
Stage 1 Speed: 0 mph
Stage 2 Grade: 0.1 %
Stage 2 HR: 96 {beats}/min
Stage 2 Speed: 0 mph
Stage 3 Grade: 10 %
Stage 3 HR: 120 {beats}/min
Stage 3 Speed: 1.7 mph
Stage 4 DBP: 72 mmHg
Stage 4 Grade: 12 %
Stage 4 HR: 137 {beats}/min
Stage 4 SBP: 169 mmHg
Stage 4 Speed: 2.5 mph
Stage 5 Grade: 14 %
Stage 5 HR: 153 {beats}/min
Stage 5 Speed: 3.5 mph
Stage 6 Grade: 0 %
Stage 6 HR: 144 {beats}/min
Stage 6 Speed: 0 mph
Stage 7 DBP: 85 mmHg
Stage 7 Grade: 0 %
Stage 7 HR: 104 {beats}/min
Stage 7 SBP: 152 mmHg
Stage 7 Speed: 0 mph

## 2015-08-25 ENCOUNTER — Telehealth: Payer: Self-pay | Admitting: Family Medicine

## 2015-08-25 NOTE — Telephone Encounter (Signed)
PT SAID THAT HE WANTED YOU TO NOW THAT HE IS HAVING SOME DIGESTIVE ISSUE AND I MADE HIM AN APPT FOR Thursday THE 10TH BUT HE WANTED ME TO PLACE A MESSAGE IN HERE ALSO. WANTS TO KNOW IF HE MAY NEED TO GO SOMEWHERE ELSE SOONER SINCE WE DO NOT HAVE ANYTHING TILL Thursday THE 10TH.

## 2015-08-26 NOTE — Telephone Encounter (Signed)
If he feels he cannot wait until then, go to urgent care

## 2015-08-28 ENCOUNTER — Ambulatory Visit: Payer: Managed Care, Other (non HMO) | Admitting: Family Medicine

## 2015-08-28 ENCOUNTER — Ambulatory Visit (INDEPENDENT_AMBULATORY_CARE_PROVIDER_SITE_OTHER): Payer: Managed Care, Other (non HMO) | Admitting: Family Medicine

## 2015-08-28 ENCOUNTER — Encounter: Payer: Self-pay | Admitting: Family Medicine

## 2015-08-28 VITALS — BP 136/82 | HR 116 | Temp 98.1°F | Resp 16 | Ht 67.0 in | Wt 219.9 lb

## 2015-08-28 DIAGNOSIS — R1013 Epigastric pain: Secondary | ICD-10-CM

## 2015-08-28 MED ORDER — PANTOPRAZOLE SODIUM 40 MG PO TBEC: 40.0000 mg | DELAYED_RELEASE_TABLET | Freq: Every day | ORAL | Status: DC

## 2015-08-28 NOTE — Telephone Encounter (Signed)
Patient has appointment to come in to be seen

## 2015-08-28 NOTE — Progress Notes (Signed)
Name: Dennis Donaldson   MRN: [id number]    DOB: Jul 05, 1966   Date:08/28/2015       Progress Note  Subjective  Chief Complaint  Chief Complaint  Patient presents with  . Gastroesophageal Reflux    patient recently finished taking a round of  Omeprazole 20mg . patient presents with burning, epigastric pressure and right upper quadrant pain. patient also reports of belching and some nausea but no vomitting.    HPI  Pt. Presents with upper abdominal discomfort since Saturday 08/23/15. Symptoms include epigastric, LUQ, and RUQ discomfort described as being 'full'. Discomfort present all the time, unrelated to meals. He has been feeling nauseous lately but no vomiting. Describes loss of appetite and thinks may have lost 2-3 lbs since last weekend. and noticed a light pale-colored stool this past weekend. He has been on Omeprazole in the past for symptoms of heartburn and reflux, and started taking Omeprazole again. He finished 30-days of Omeprazole which helped his reflux.   Past Medical History  Diagnosis Date  . Asthma   . GERD (gastroesophageal reflux disease)   . Cancer ([place])   . Hypercholesteremia     Past Surgical History  Procedure Laterality Date  . Rotator cuff repair Left 2012  . Uvulopalatopharyngoplasty    . Tonsillectomy and adenoidectomy    . Sinus exploration      Family History  Problem Relation Age of Onset  . Thyroid disease Mother   . Hypertension Mother   . Diabetes Maternal Grandmother   . Diabetes Maternal Grandfather     Social History   Social History  . Marital Status: Married    Spouse Name: N/A  . Number of Children: N/A  . Years of Education: N/A   Occupational History  . Not on file.   Social History Main Topics  . Smoking status: Never Smoker   . Smokeless tobacco: Never Used  . Alcohol Use: No  . Drug Use: No  . Sexual Activity:    Partners: Female   Other Topics Concern  . Not on file   Social History Narrative     Current  outpatient prescriptions:  .  albuterol (PROAIR HFA) 108 (90 BASE) MCG/ACT inhaler, Inhale 2 puffs into the lungs every 6 (six) hours as needed for wheezing or shortness of breath., Disp: 1 Inhaler, Rfl: 3 .  beclomethasone (QVAR) 80 MCG/ACT inhaler, Inhale 2 puffs into the lungs 2 (two) times daily., Disp: 3 Inhaler, Rfl: 1 .  omeprazole (PRILOSEC) 20 MG capsule, Take 1 capsule (20 mg total) by mouth daily., Disp: 90 capsule, Rfl: 1  Allergies  Allergen Reactions  . Erythromycin     Other reaction(s): Other (See Comments) Other Reaction: GI Upset   Review of Systems  Constitutional: Positive for weight loss. Negative for fever, chills and malaise/fatigue.  Gastrointestinal: Positive for heartburn, nausea and abdominal pain. Negative for vomiting, diarrhea, constipation, blood in stool (has noticed dark colored stool from when he takes Pepto Bismol.) and melena.   Objective  Filed Vitals:   08/28/15 1140  BP: 136/82  Pulse: 116  Temp: 98.1 F (36.7 C)  TempSrc: Oral  Resp: 16  Height: 5\' 7"  (1.702 m)  Weight: 219 lb 14.4 oz (99.746 kg)  SpO2: 97%    Physical Exam  Constitutional: He is oriented to person, place, and time and well-developed, well-nourished, and in no distress.  HENT:  Head: Normocephalic and atraumatic.  Cardiovascular: Normal rate, regular rhythm and normal heart sounds.   No murmur  heard. Pulmonary/Chest: Effort normal and breath sounds normal. He has no wheezes.  Abdominal: Soft. Bowel sounds are normal. There is tenderness (epigastric and RUQ tenderness to palpation.). There is no guarding.  Neurological: He is alert and oriented to person, place, and time.  Nursing note and vitals reviewed.  Assessment & Plan  1. Abdominal discomfort, epigastric  Hemoccult was negative in office today. Started patient on Protonix 40 mg and referral to gastroenterology for consideration of endoscopy.  - CBC with Differential - Comprehensive Metabolic Panel (CMET) -  Amylase - Lipase - Ambulatory referral to Gastroenterology - POC Hemoccult Bld/Stl (1-Cd Office Dx) - pantoprazole (PROTONIX) 40 MG tablet; Take 1 tablet (40 mg total) by mouth daily.  Dispense: 30 tablet; Refill: 0   Kaileb Monsanto [facility] 08/28/2015 11:51 AM

## 2015-08-29 ENCOUNTER — Encounter: Payer: Self-pay | Admitting: Gastroenterology

## 2015-08-29 LAB — COMPREHENSIVE METABOLIC PANEL
ALBUMIN: 4.8 g/dL (ref 3.5–5.5)
ALT: 33 IU/L (ref 0–44)
AST: 27 IU/L (ref 0–40)
Albumin/Globulin Ratio: 1.8 (ref 1.1–2.5)
Alkaline Phosphatase: 72 IU/L (ref 39–117)
BUN / CREAT RATIO: 10 (ref 9–20)
BUN: 12 mg/dL (ref 6–24)
Bilirubin Total: 0.6 mg/dL (ref 0.0–1.2)
CO2: 25 mmol/L (ref 18–29)
CREATININE: 1.19 mg/dL (ref 0.76–1.27)
Calcium: 10.1 mg/dL (ref 8.7–10.2)
Chloride: 100 mmol/L (ref 97–106)
GFR, EST AFRICAN AMERICAN: 83 mL/min/{1.73_m2} (ref >59)
GFR, EST NON AFRICAN AMERICAN: 72 mL/min/{1.73_m2} (ref >59)
GLUCOSE: 110 mg/dL — AB (ref 65–99)
Globulin, Total: 2.7 g/dL (ref 1.5–4.5)
Potassium: 4.2 mmol/L (ref 3.5–5.2)
Sodium: 143 mmol/L (ref 136–144)
TOTAL PROTEIN: 7.5 g/dL (ref 6.0–8.5)

## 2015-08-29 LAB — CBC WITH DIFFERENTIAL/PLATELET
BASOS ABS: 0 10*3/uL (ref 0.0–0.2)
Basos: 0 %
EOS (ABSOLUTE): 0.1 10*3/uL (ref 0.0–0.4)
EOS: 1 %
HEMATOCRIT: 45.8 % (ref 37.5–51.0)
HEMOGLOBIN: 16 g/dL (ref 12.6–17.7)
IMMATURE GRANS (ABS): 0 10*3/uL (ref 0.0–0.1)
Immature Granulocytes: 0 %
Lymphocytes Absolute: 1.4 10*3/uL (ref 0.7–3.1)
Lymphs: 23 %
MCH: 30.4 pg (ref 26.6–33.0)
MCHC: 34.9 g/dL (ref 31.5–35.7)
MCV: 87 fL (ref 79–97)
MONOCYTES: 9 %
Monocytes Absolute: 0.5 10*3/uL (ref 0.1–0.9)
NEUTROS ABS: 4.3 10*3/uL (ref 1.4–7.0)
Neutrophils: 67 %
Platelets: 206 10*3/uL (ref 150–379)
RBC: 5.26 x10E6/uL (ref 4.14–5.80)
RDW: 14.6 % (ref 12.3–15.4)
WBC: 6.4 10*3/uL (ref 3.4–10.8)

## 2015-08-29 LAB — LIPASE: Lipase: 35 U/L (ref 0–59)

## 2015-08-29 LAB — AMYLASE: Amylase: 71 U/L (ref 31–124)

## 2015-09-01 ENCOUNTER — Ambulatory Visit (INDEPENDENT_AMBULATORY_CARE_PROVIDER_SITE_OTHER): Payer: Managed Care, Other (non HMO) | Admitting: Family Medicine

## 2015-09-01 ENCOUNTER — Encounter: Payer: Self-pay | Admitting: Family Medicine

## 2015-09-01 DIAGNOSIS — J069 Acute upper respiratory infection, unspecified: Secondary | ICD-10-CM

## 2015-09-01 DIAGNOSIS — R058 Other specified cough: Secondary | ICD-10-CM | POA: Insufficient documentation

## 2015-09-01 DIAGNOSIS — B9789 Other viral agents as the cause of diseases classified elsewhere: Secondary | ICD-10-CM

## 2015-09-01 DIAGNOSIS — R05 Cough: Secondary | ICD-10-CM | POA: Insufficient documentation

## 2015-09-01 DIAGNOSIS — R6889 Other general symptoms and signs: Secondary | ICD-10-CM

## 2015-09-01 LAB — POCT INFLUENZA A/B
INFLUENZA B, POC: NEGATIVE
Influenza A, POC: NEGATIVE

## 2015-09-01 MED ORDER — BENZONATATE 200 MG PO CAPS: 200.0000 mg | ORAL_CAPSULE | Freq: Three times a day (TID) | ORAL | Status: DC | PRN

## 2015-09-01 NOTE — Progress Notes (Signed)
Name: Dennis Donaldson   MRN: [id number]    DOB: Mar 27, 1966   Date:09/01/2015       Progress Note  Subjective  Chief Complaint  Chief Complaint  Patient presents with  . Fever  . Cough    Head Congestion, chills x3 days    Cough This is a new problem. The current episode started yesterday. The cough is non-productive. Associated symptoms include chills, a fever, headaches and myalgias. Pertinent negatives include no ear pain, rhinorrhea, sore throat or shortness of breath (complains of chest congestion). Treatments tried: Dayquil this AM.    Past Medical History  Diagnosis Date  . Asthma   . GERD (gastroesophageal reflux disease)   . Cancer ([place])   . Hypercholesteremia     Past Surgical History  Procedure Laterality Date  . Rotator cuff repair Left 2012  . Uvulopalatopharyngoplasty    . Tonsillectomy and adenoidectomy    . Sinus exploration      Family History  Problem Relation Age of Onset  . Thyroid disease Mother   . Hypertension Mother   . Diabetes Maternal Grandmother   . Diabetes Maternal Grandfather     Social History   Social History  . Marital Status: Married    Spouse Name: N/A  . Number of Children: N/A  . Years of Education: N/A   Occupational History  . Not on file.   Social History Main Topics  . Smoking status: Never Smoker   . Smokeless tobacco: Never Used  . Alcohol Use: No  . Drug Use: No  . Sexual Activity:    Partners: Female   Other Topics Concern  . Not on file   Social History Narrative     Current outpatient prescriptions:  .  albuterol (PROAIR HFA) 108 (90 BASE) MCG/ACT inhaler, Inhale 2 puffs into the lungs every 6 (six) hours as needed for wheezing or shortness of breath., Disp: 1 Inhaler, Rfl: 3 .  beclomethasone (QVAR) 80 MCG/ACT inhaler, Inhale 2 puffs into the lungs 2 (two) times daily., Disp: 3 Inhaler, Rfl: 1 .  pantoprazole (PROTONIX) 40 MG tablet, Take 1 tablet (40 mg total) by mouth daily., Disp: 30 tablet, Rfl:  0  Allergies  Allergen Reactions  . Erythromycin     Other reaction(s): Other (See Comments) Other Reaction: GI Upset   Review of Systems  Constitutional: Positive for fever and chills.  HENT: Negative for ear pain, rhinorrhea and sore throat.   Respiratory: Positive for cough. Negative for shortness of breath (complains of chest congestion).   Musculoskeletal: Positive for myalgias.  Neurological: Positive for headaches.      Objective  Filed Vitals:   09/01/15 1214  BP: 140/75  Pulse: 142  Temp: 100.1 F (37.8 C)  TempSrc: Oral  Resp: 20  Height: 5\' 7"  (1.702 m)  Weight: 221 lb (100.245 kg)  SpO2: 97%    Physical Exam  Constitutional: He is well-developed, well-nourished, and in no distress.  HENT:  Nose: Right sinus exhibits maxillary sinus tenderness. Left sinus exhibits maxillary sinus tenderness.  Mouth/Throat: Posterior oropharyngeal edema and posterior oropharyngeal erythema present.  Dark-colored mucus visualized at the back of throat, otherwise oropharynx not clearly visualized due to strong gag reflex.  Cardiovascular: Tachycardia present.   Pulmonary/Chest: Breath sounds normal. He has no decreased breath sounds. He has no wheezes.  Nursing note and vitals reviewed.   Assessment & Plan  1. Flu-like symptoms  Influenza A and B are negative. - POCT Influenza A/B  2. Viral  URI with cough  Likely viral upper respiratory infection. Advised on conservative measures including increased fluid intake and antitussive therapy. If symptoms do not improve within the next 3 days, he will contact us for antibiotic therapy. Patient verbalized understanding with the plan.  - benzonatate (TESSALON) 200 MG capsule; Take 1 capsule (200 mg total) by mouth 3 (three) times daily as needed for cough.  Dispense: 21 capsule; Refill: 0   Iliza Blankenbeckler [facility] 09/01/2015 12:30 PM

## 2015-09-03 ENCOUNTER — Telehealth: Payer: Self-pay | Admitting: Family Medicine

## 2015-09-03 NOTE — Telephone Encounter (Signed)
Pt states he was advised to give Dr Manuella Ghazi an update on how he is feeling. Pt states his fever is gone but he still has cough/congestion. Pt still has not gone back to work and wants to know if its ok if he does? Pt would like to be advised if he needs to come back in for another appt?

## 2015-09-03 NOTE — Telephone Encounter (Signed)
Routed to Dr. Shah for advice  

## 2015-09-03 NOTE — Telephone Encounter (Signed)
Spoke with patient and explained that he does not need an antibiotic at this time, he does not need to come in for another appointment at this time and that he can return to work if he has been free of any fever for over 24 hours. Patient verbalized understanding.

## 2015-09-08 NOTE — Telephone Encounter (Signed)
ERRENOUS °

## 2015-10-07 ENCOUNTER — Ambulatory Visit: Payer: Managed Care, Other (non HMO) | Admitting: Gastroenterology

## 2015-10-07 ENCOUNTER — Encounter: Payer: Self-pay | Admitting: Gastroenterology

## 2015-10-07 ENCOUNTER — Ambulatory Visit (INDEPENDENT_AMBULATORY_CARE_PROVIDER_SITE_OTHER): Payer: Managed Care, Other (non HMO) | Admitting: Gastroenterology

## 2015-10-07 VITALS — BP 139/86 | HR 88 | Temp 98.8°F | Ht 66.5 in | Wt 224.0 lb

## 2015-10-07 DIAGNOSIS — R0789 Other chest pain: Secondary | ICD-10-CM | POA: Diagnosis not present

## 2015-10-07 NOTE — Progress Notes (Signed)
Gastroenterology Consultation  Referring Provider:     Ashok Norris, MD Primary Care Physician:  Ashok Norris, MD Primary Gastroenterologist:  Dr. Allen Norris     Reason for Consultation:     Chest pain        HPI:   Dennis Donaldson is a 49 y.o. y/o male referred for consultation & management of chest discomfort by Dr. Ashok Norris, MD.  This patient comes today with a history of having a bowel syndrome. The patient also reports that he has been having chest discomfort recently. The patient was started on Protonix and states that his symptoms got better but then he started having more pain for the Protonix. The patient stopped taking the Protonix and his symptoms came back. There is no report of any weight loss, fevers, chills, black stools or bloody stools. The patient never had a colonoscopy in the past. There is no association of the pain with any food or drinking also denies any thing making the pain better or worse although sometimes intimately has the pain with eating.  Past Medical History  Diagnosis Date  . Asthma   . GERD (gastroesophageal reflux disease)   . Hypercholesteremia   . 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 Gastritis     Past Surgical History  Procedure Laterality Date  . Rotator cuff repair Left 2012  . Uvulopalatopharyngoplasty    . Tonsillectomy and adenoidectomy    . Sinus exploration    . Esophagogastroduodenoscopy endoscopy  2013    Prior to Admission medications   Medication Sig Start Date End Date Taking? Authorizing Provider  albuterol (PROAIR HFA) 108 (90 BASE) MCG/ACT inhaler Inhale 2 puffs into the lungs every 6 (six) hours as needed for wheezing or shortness of breath. 07/01/15  Yes Ashok Norris, MD  beclomethasone (QVAR) 80 MCG/ACT inhaler Inhale 2 puffs into the lungs 2 (two) times daily. 07/04/15  Yes Ashok Norris, MD  desonide (DESOWEN) 0.05 % cream Apply 1 application topically 1 day or 1 dose.   Yes Historical Provider, MD    pantoprazole (PROTONIX) 40 MG tablet Take 1 tablet (40 mg total) by mouth daily. 08/28/15  Yes Roselee Nova, MD    Family History  Problem Relation Age of Onset  . Thyroid disease Mother   . Hypertension Mother   . Diabetes Maternal Grandmother   . Diabetes Maternal Grandfather      Social History  Substance Use Topics  . Smoking status: Never Smoker   . Smokeless tobacco: Never Used  . Alcohol Use: No    Allergies as of 10/07/2015 - Review Complete 10/07/2015  Allergen Reaction Noted  . Erythromycin  04/24/2015    Review of Systems:    All systems reviewed and negative except where noted in HPI.   Physical Exam:  BP 139/86 mmHg  Pulse 88  Temp(Src) 98.8 F (37.1 C) (Oral)  Ht 5' 6.5" (1.689 m)  Wt 224 lb (101.606 kg)  BMI 35.62 kg/m2 No LMP for male patient. Psych:  Alert and cooperative. Normal mood and affect. General:   Alert,  Well-developed, well-nourished, pleasant and cooperative in NAD Head:  Normocephalic and atraumatic. Eyes:  Sclera clear, no icterus.   Conjunctiva pink. Ears:  Normal auditory acuity. Nose:  No deformity, discharge, or lesions. Mouth:  No deformity or lesions,oropharynx pink & moist. Neck:  Supple; no masses or thyromegaly. Lungs:  Respirations even and unlabored.  Clear throughout to auscultation.   No wheezes, crackles,  or rhonchi. No acute distress. Heart:  Regular rate and rhythm; no murmurs, clicks, rubs, or gallops. Abdomen:  Normal bowel sounds.  No bruits.  Soft, non-tender and non-distended without masses, hepatosplenomegaly or hernias noted.  No guarding or rebound tenderness.  Negative Carnett sign.   Rectal:  Deferred.  Msk:  Symmetrical without gross deformities.  Good, equal movement & strength bilaterally. Pulses:  Normal pulses noted. Extremities:  No clubbing or edema.  No cyanosis. Neurologic:  Alert and oriented x3;  grossly normal neurologically. Skin:  Intact without significant lesions or rashes.  No  jaundice. Lymph Nodes:  No significant cervical adenopathy. Psych:  Alert and cooperative. Normal mood and affect.  Imaging Studies: No results found.  Assessment and Plan:   Dennis Donaldson is a 49 y.o. y/o male is in today with a history of chest discomfort most likely from heartburn versus esophageal spasms. There is no exacerbating or relieving factors that he can recall. The patient will be started on Dexilant after his reaction with Protonix. The patient will contact me if his symptoms do not improve. The patient has been explained the plan and agrees with it.   Note: This dictation was prepared with Dragon dictation along with smaller phrase technology. Any transcriptional errors that result from this process are unintentional.

## 2015-10-23 ENCOUNTER — Telehealth: Payer: Self-pay | Admitting: Gastroenterology

## 2015-10-23 ENCOUNTER — Other Ambulatory Visit: Payer: Self-pay

## 2015-10-23 DIAGNOSIS — K2100 Gastro-esophageal reflux disease with esophagitis, without bleeding: Secondary | ICD-10-CM

## 2015-10-23 MED ORDER — DEXLANSOPRAZOLE 60 MG PO CPDR: 60.0000 mg | DELAYED_RELEASE_CAPSULE | Freq: Every day | ORAL | Status: DC

## 2015-10-23 NOTE — Telephone Encounter (Signed)
Patient left a voice message that he needed a RX called in. He didn't state which one.

## 2015-10-23 NOTE — Telephone Encounter (Signed)
Rx for Dexilant 60mg  daily was sent to pt's pharmacy. Dr. Allen Norris had given samples during last ov and this is working.

## 2015-10-28 ENCOUNTER — Ambulatory Visit (INDEPENDENT_AMBULATORY_CARE_PROVIDER_SITE_OTHER): Payer: Managed Care, Other (non HMO) | Admitting: Family Medicine

## 2015-10-28 ENCOUNTER — Ambulatory Visit: Payer: Managed Care, Other (non HMO) | Admitting: Family Medicine

## 2015-10-28 ENCOUNTER — Encounter: Payer: Self-pay | Admitting: Family Medicine

## 2015-10-28 ENCOUNTER — Other Ambulatory Visit: Payer: Self-pay

## 2015-10-28 VITALS — BP 112/76 | HR 100 | Temp 98.3°F | Resp 16 | Ht 66.5 in | Wt 228.1 lb

## 2015-10-28 DIAGNOSIS — R091 Pleurisy: Secondary | ICD-10-CM

## 2015-10-28 DIAGNOSIS — E785 Hyperlipidemia, unspecified: Secondary | ICD-10-CM

## 2015-10-28 DIAGNOSIS — M5090 Cervical disc disorder, unspecified, unspecified cervical region: Secondary | ICD-10-CM | POA: Diagnosis not present

## 2015-10-28 DIAGNOSIS — E669 Obesity, unspecified: Secondary | ICD-10-CM | POA: Diagnosis not present

## 2015-10-28 MED ORDER — CYCLOBENZAPRINE HCL 5 MG PO TABS: 5.0000 mg | ORAL_TABLET | Freq: Three times a day (TID) | ORAL | Status: DC | PRN

## 2015-10-28 MED ORDER — GABAPENTIN 300 MG PO CAPS: 300.0000 mg | ORAL_CAPSULE | Freq: Three times a day (TID) | ORAL | Status: DC

## 2015-10-28 MED ORDER — DESONIDE 0.05 % EX LOTN: TOPICAL_LOTION | Freq: Two times a day (BID) | CUTANEOUS | Status: AC

## 2015-10-28 MED ORDER — MELOXICAM 15 MG PO TABS: 15.0000 mg | ORAL_TABLET | Freq: Every day | ORAL | Status: DC

## 2015-10-28 MED ORDER — PREDNISONE 20 MG PO TABS: 20.0000 mg | ORAL_TABLET | Freq: Every day | ORAL | Status: DC

## 2015-10-28 NOTE — Progress Notes (Signed)
Name: Dennis Donaldson   MRN: [id number]    DOB: [birth year]   Date:10/28/2015       Progress Note  Subjective  Chief Complaint  Chief Complaint  Patient presents with  . Neck Pain    patient is here for his 9-month follow-up regarding DDD of C-spine. patient takes ibuprofen PRN  . Shoulder Pain    patient presents with right shoulder pain for about 19-month. limit ROM. radiates from pecs to shoulder blade  . Medication Refill    desowen cream    Neck Pain  Associated symptoms include chest pain (pleuritic in the right axillary area). Pertinent negatives include no fever, headaches, tingling, weakness or weight loss.  Shoulder Pain  Pertinent negatives include no fever, itching or tingling.    Neck and shoulder pain patient has a history of degenerative disc disease of the C-spine has been taking ibuprofen or medically for the last 6 months. He is now having pain radiating down into the right shoulder and pectoralis area as the area as well as to shoulder blade. The pain is sharp and is worse at night. No significant relief of his ibuprofen.  Pleurisy  Several week history of right pleuritic chest pain. He did have mild URI prior to his onset. It is worth with deep breaths or turns. There's been no hemoptysis no fever or chills. No anterior chest pain or palpitations shortness of breath or hemoptysis.   Past Medical History  Diagnosis Date  . Asthma   . GERD (gastroesophageal reflux disease)   . Hypercholesteremia   . 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 Gastritis     Social History  Substance Use Topics  . Smoking status: Never Smoker   . Smokeless tobacco: Never Used  . Alcohol Use: No     Current outpatient prescriptions:  .  beclomethasone (QVAR) 80 MCG/ACT inhaler, Inhale 2 puffs into the lungs 2 (two) times daily., Disp: 3 Inhaler, Rfl: 1 .  desonide (DESOWEN) 0.05 % cream, Apply 1 application topically 1 day or 1 dose., Disp: , Rfl:  .  dexlansoprazole (DEXILANT) 60  MG capsule, Take 1 capsule (60 mg total) by mouth daily., Disp: 30 capsule, Rfl: 11 .  albuterol (PROAIR HFA) 108 (90 BASE) MCG/ACT inhaler, Inhale 2 puffs into the lungs every 6 (six) hours as needed for wheezing or shortness of breath. (Patient not taking: Reported on 10/28/2015), Disp: 1 Inhaler, Rfl: 3  Allergies  Allergen Reactions  . Erythromycin     Other reaction(s): Other (See Comments) Other Reaction: GI Upset    Review of Systems  Constitutional: Negative for fever, chills and weight loss.  HENT: Negative for congestion, hearing loss, sore throat and tinnitus.   Eyes: Negative for blurred vision, double vision and redness.  Respiratory: Negative for cough, hemoptysis and shortness of breath.   Cardiovascular: Positive for chest pain (pleuritic in the right axillary area). Negative for palpitations, orthopnea, claudication and leg swelling.  Gastrointestinal: Negative for heartburn, nausea, vomiting, diarrhea, constipation and blood in stool.  Genitourinary: Negative for dysuria, urgency, frequency and hematuria.  Musculoskeletal: Positive for neck pain. Negative for myalgias, back pain, joint pain and falls.  Skin: Negative for itching.  Neurological: Negative for dizziness, tingling, tremors, focal weakness, seizures, loss of consciousness, weakness and headaches.  Endo/Heme/Allergies: Does not bruise/bleed easily.  Psychiatric/Behavioral: Negative for depression and substance abuse. The patient is not nervous/anxious and does not have insomnia.      Objective  Filed Vitals:  10/28/15 1021  BP: 112/76  Pulse: 100  Temp: 98.3 F (36.8 C)  TempSrc: Oral  Resp: 16  Height: 5' 6.5" (1.689 m)  Weight: 228 lb 1.6 oz (103.465 kg)  SpO2: 97%     Physical Exam  Constitutional: He is oriented to person, place, and time.  Obese in no acute distress  HENT:  Head: Normocephalic.  Eyes: EOM are normal. Pupils are equal, round, and reactive to light.  Neck: No thyromegaly  present.  Tender  Cardiovascular: Normal rate, regular rhythm and normal heart sounds.   No murmur heard. Pulmonary/Chest: Effort normal. No respiratory distress. He has no wheezes.  Acute crackles noted in the right lower axillary area with an separation mild tenderness to palpation of the ribs in that area  Abdominal: Soft. Bowel sounds are normal.  Musculoskeletal: Normal range of motion. He exhibits no edema.  There is slight limitation of lateral rotation of the C-spine. There is marked tenderness to palpation along the posterior cervical area about C5-6 and also on the sternocleidomastoid trapezius and subscapular area on the right. DTRs are diminished on the right. There is normal strength.  Lymphadenopathy:    He has no cervical adenopathy.  Neurological: He is alert and oriented to person, place, and time. No cranial nerve deficit. Gait normal. Coordination normal.  Skin: Skin is warm and dry. No rash noted.  Psychiatric: Affect and judgment normal.      Assessment & Plan  1. Cervical disc disease Worsening - predniSONE (DELTASONE) 20 MG tablet; Take 1 tablet (20 mg total) by mouth daily with breakfast.  Dispense: 10 tablet; Refill: 0 - meloxicam (MOBIC) 15 MG tablet; Take 1 tablet (15 mg total) by mouth daily.  Dispense: 30 tablet; Refill: 0 - gabapentin (NEURONTIN) 300 MG capsule; Take 1 capsule (300 mg total) by mouth 3 (three) times daily.  Dispense: 90 capsule; Refill: 3 - cyclobenzaprine (FLEXERIL) 5 MG tablet; Take 1 tablet (5 mg total) by mouth 3 (three) times daily as needed for muscle spasms.  Dispense: 30 tablet; Refill: 1  2. Pleurisy Should be covered by his prednisone a meloxicam  3. Obesity Needs diet and exercise weight reduction  4. Hyperlipidemia Recheck on return visit

## 2015-10-28 NOTE — Patient Instructions (Signed)
Pleurisy  Pleurisy is an inflammation and swelling of the lining of the lungs (pleura). Because of this inflammation, it hurts to breathe. It can be aggravated by coughing, laughing, or deep breathing. Pleurisy is often caused by an underlying infection or disease.   HOME CARE INSTRUCTIONS   Monitor your pleurisy for any changes. The following actions may help to alleviate any discomfort you are experiencing:  · Medicine may help with pain. Only take over-the-counter or prescription medicines for pain, discomfort, or fever as directed by your health care provider.  · Only take antibiotic medicine as directed. Make sure to finish it even if you start to feel better.  SEEK MEDICAL CARE IF:   · Your pain is not controlled with medicine or is increasing.  · You have an increase in pus-like (purulent) secretions brought up with coughing.  SEEK IMMEDIATE MEDICAL CARE IF:   · You have blue or dark lips, fingernails, or toenails.  · You are coughing up blood.  · You have increased difficulty breathing.  · You have continuing pain unrelieved by medicine or pain lasting more than 1 week.  · You have pain that radiates into your neck, arms, or jaw.  · You develop increased shortness of breath or wheezing.  · You develop a fever, rash, vomiting, fainting, or other serious symptoms.  MAKE SURE YOU:  · Understand these instructions.    · Will watch your condition.    · Will get help right away if you are not doing well or get worse.        This information is not intended to replace advice given to you by your health care provider. Make sure you discuss any questions you have with your health care provider.     Document Released: 10/04/2005 Document Revised: 06/06/2013 Document Reviewed: 03/18/2013  Elsevier Interactive Patient Education ©2016 [employer]

## 2015-11-17 ENCOUNTER — Encounter: Payer: Managed Care, Other (non HMO) | Admitting: Family Medicine

## 2015-12-01 ENCOUNTER — Ambulatory Visit: Payer: Managed Care, Other (non HMO) | Admitting: Family Medicine

## 2015-12-23 ENCOUNTER — Encounter: Payer: Managed Care, Other (non HMO) | Admitting: Family Medicine

## 2015-12-24 ENCOUNTER — Telehealth: Payer: Self-pay

## 2015-12-24 NOTE — Telephone Encounter (Signed)
Please review pt's office visit from 10/07/15. You had advised pt if he was not any better to give [place] a call. Please advise of next step for pt.

## 2015-12-26 NOTE — Telephone Encounter (Signed)
Pt scheduled for esophageal manometry at [facility] on 01/14/16. Orders have been faxed to Endo and instructions with appt date has been mailed to pt.

## 2015-12-26 NOTE — Telephone Encounter (Signed)
LVM for pt to return my call to schedule esophageal manometry.

## 2015-12-26 NOTE — Telephone Encounter (Signed)
If he is still having the chest pain then find out if he is on the PPI. If yes then set him up for esophageal manometry.

## 2016-01-14 ENCOUNTER — Ambulatory Visit
Admission: RE | Admit: 2016-01-14 | Discharge: 2016-01-14 | Disposition: A | Discharge status: Home / Self Care | Payer: Managed Care, Other (non HMO) | Source: Ambulatory Visit | Attending: Gastroenterology | Admitting: Gastroenterology

## 2016-01-14 ENCOUNTER — Encounter
Admission: RE | Disposition: A | Discharge status: Home / Self Care | Payer: Self-pay | Source: Ambulatory Visit | Attending: Gastroenterology

## 2016-01-14 DIAGNOSIS — R079 Chest pain, unspecified: Secondary | ICD-10-CM | POA: Diagnosis present

## 2016-01-14 DIAGNOSIS — Z79899 Other long term (current) drug therapy: Secondary | ICD-10-CM | POA: Insufficient documentation

## 2016-01-14 HISTORY — PX: ESOPHAGEAL MANOMETRY: SHX5429

## 2016-01-14 SURGERY — MANOMETRY, ESOPHAGUS

## 2016-01-14 MED ORDER — LIDOCAINE HCL 2 % EX GEL
CUTANEOUS | Status: AC
  Administered 2016-01-14: 3
  Filled 2016-01-14: qty 5

## 2016-01-14 MED ORDER — BUTAMBEN-TETRACAINE-BENZOCAINE 2-2-14 % EX AERO
1.0000 | INHALATION_SPRAY | Freq: Once | CUTANEOUS | Status: AC
  Administered 2016-01-14: 1 via TOPICAL

## 2016-01-14 MED ORDER — BUTAMBEN-TETRACAINE-BENZOCAINE 2-2-14 % EX AERO
INHALATION_SPRAY | CUTANEOUS | Status: AC
  Administered 2016-01-14: 1 via TOPICAL
  Filled 2016-01-14: qty 20

## 2016-01-14 MED ORDER — LIDOCAINE HCL 2 % EX GEL
1.0000 "application " | Freq: Once | CUTANEOUS | Status: AC
  Administered 2016-01-14: 3

## 2016-01-14 SURGICAL SUPPLY — 2 items
FACESHIELD LNG OPTICON STERILE (SAFETY) IMPLANT
GLOVE BIO SURGEON STRL SZ8 (GLOVE) ×6 IMPLANT

## 2016-01-16 ENCOUNTER — Encounter: Payer: Self-pay | Admitting: Gastroenterology

## 2016-01-23 ENCOUNTER — Other Ambulatory Visit: Payer: Self-pay

## 2016-01-23 DIAGNOSIS — K2100 Gastro-esophageal reflux disease with esophagitis, without bleeding: Secondary | ICD-10-CM

## 2016-01-23 MED ORDER — DEXLANSOPRAZOLE 60 MG PO CPDR: 60.0000 mg | DELAYED_RELEASE_CAPSULE | Freq: Every day | ORAL | Status: DC

## 2016-01-29 ENCOUNTER — Telehealth: Payer: Self-pay | Admitting: Gastroenterology

## 2016-01-29 NOTE — Telephone Encounter (Signed)
Patient called and would like to know if you have the results from his test back?

## 2016-01-29 NOTE — Telephone Encounter (Signed)
Pt stated he is still having the pressure and pain in his chest. He would like to know the next step. Refer to [facility]?

## 2016-02-02 NOTE — Telephone Encounter (Signed)
Please order a CT scan of the chest on this patient due to the abnormal manometry and chest pain.

## 2016-02-04 ENCOUNTER — Ambulatory Visit
Admission: RE | Admit: 2016-02-04 | Discharge: 2016-02-04 | Disposition: A | Discharge status: Home / Self Care | Payer: Worker's Compensation | Source: Ambulatory Visit | Attending: Family | Admitting: Family

## 2016-02-04 ENCOUNTER — Other Ambulatory Visit: Payer: Self-pay | Admitting: Family

## 2016-02-04 ENCOUNTER — Other Ambulatory Visit: Payer: Self-pay

## 2016-02-04 ENCOUNTER — Ambulatory Visit
Admission: RE | Admit: 2016-02-04 | Discharge: 2016-02-04 | Disposition: A | Discharge status: Home / Self Care | Payer: Managed Care, Other (non HMO) | Source: Ambulatory Visit | Attending: Family | Admitting: Family

## 2016-02-04 DIAGNOSIS — X501XXA Overexertion from prolonged static or awkward postures, initial encounter: Secondary | ICD-10-CM | POA: Diagnosis not present

## 2016-02-04 DIAGNOSIS — R52 Pain, unspecified: Secondary | ICD-10-CM

## 2016-02-04 DIAGNOSIS — M25571 Pain in right ankle and joints of right foot: Secondary | ICD-10-CM | POA: Insufficient documentation

## 2016-02-04 DIAGNOSIS — R0789 Other chest pain: Secondary | ICD-10-CM

## 2016-02-04 DIAGNOSIS — S9304XA Dislocation of right ankle joint, initial encounter: Secondary | ICD-10-CM | POA: Diagnosis not present

## 2016-02-04 NOTE — Telephone Encounter (Signed)
Left vm letting pt know a CT scan of chest has been ordered. Pt advised to call with questions.

## 2016-02-05 ENCOUNTER — Encounter: Payer: Managed Care, Other (non HMO) | Admitting: Family Medicine

## 2016-02-19 NOTE — Telephone Encounter (Signed)
Pt returned call regarding CT scan. Pt still would like to move for with this. Contacted central scheduling and they will call him to schedule.

## 2016-02-19 NOTE — Telephone Encounter (Signed)
LVM again regarding CT scan of chest Dr. Allen Norris has recommended. Requested pt to return call if he would like to proceed with scan.

## 2016-03-26 ENCOUNTER — Ambulatory Visit
Admission: RE | Admit: 2016-03-26 | Discharge: 2016-03-26 | Disposition: A | Discharge status: Home / Self Care | Payer: Managed Care, Other (non HMO) | Source: Ambulatory Visit | Attending: Gastroenterology | Admitting: Gastroenterology

## 2016-03-26 DIAGNOSIS — R932 Abnormal findings on diagnostic imaging of liver and biliary tract: Secondary | ICD-10-CM | POA: Diagnosis not present

## 2016-03-26 DIAGNOSIS — R0789 Other chest pain: Secondary | ICD-10-CM | POA: Insufficient documentation

## 2016-03-26 DIAGNOSIS — R918 Other nonspecific abnormal finding of lung field: Secondary | ICD-10-CM | POA: Insufficient documentation

## 2016-03-26 MED ORDER — IOPAMIDOL (ISOVUE-300) INJECTION 61%
75.0000 mL | Freq: Once | INTRAVENOUS | Status: AC | PRN
  Administered 2016-03-26: 75 mL via INTRAVENOUS

## 2016-04-05 ENCOUNTER — Telehealth: Payer: Self-pay | Admitting: Gastroenterology

## 2016-04-05 NOTE — Telephone Encounter (Signed)
Patient would like results from his CT scan and also he has some new symtoms that he would like to talk to you about. He's not sure if he needs an appointment.

## 2016-04-08 NOTE — Telephone Encounter (Signed)
Spoke with pt about ct scan results. Pt stated he is still having the same symptoms as before but no he is feeling nauseated. Would you like me to just schedule a follow up appt to discuss?

## 2016-04-08 NOTE — Telephone Encounter (Signed)
Yes please

## 2016-04-15 ENCOUNTER — Ambulatory Visit (INDEPENDENT_AMBULATORY_CARE_PROVIDER_SITE_OTHER): Payer: Managed Care, Other (non HMO) | Admitting: Gastroenterology

## 2016-04-15 VITALS — BP 137/95 | HR 103 | Temp 98.7°F | Ht 66.0 in | Wt 223.0 lb

## 2016-04-15 DIAGNOSIS — R079 Chest pain, unspecified: Secondary | ICD-10-CM | POA: Diagnosis not present

## 2016-04-15 MED ORDER — RANITIDINE HCL 150 MG PO TABS: 150.0000 mg | ORAL_TABLET | Freq: Two times a day (BID) | ORAL | Status: DC

## 2016-04-15 NOTE — Progress Notes (Signed)
Primary Care Physician: Ashok Norris, MD  Primary Gastroenterologist:  Dr. Lucilla Lame  Chief Complaint  Patient presents with  . Follow-up    Chest pains    HPI: Dennis Donaldson is a 50 y.o. male here for continued chest pain.  The patient states that his symptoms got better with the [place] but it caused him to have diarrhea. The patient's esophageal studies did not show any sign of a cause for his chest pain.  Current Outpatient Prescriptions  Medication Sig Dispense Refill  . albuterol (PROAIR HFA) 108 (90 BASE) MCG/ACT inhaler Inhale 2 puffs into the lungs every 6 (six) hours as needed for wheezing or shortness of breath. 1 Inhaler 3  . beclomethasone (QVAR) 80 MCG/ACT inhaler Inhale 2 puffs into the lungs 2 (two) times daily. 3 Inhaler 1  . meloxicam (MOBIC) 15 MG tablet Take 1 tablet (15 mg total) by mouth daily. 30 tablet 0  . cyclobenzaprine (FLEXERIL) 5 MG tablet Take 1 tablet (5 mg total) by mouth 3 (three) times daily as needed for muscle spasms. (Patient not taking: Reported on 04/15/2016) 30 tablet 1  . desonide (DESOWEN) 0.05 % lotion Apply topically 2 (two) times daily. (Patient not taking: Reported on 04/15/2016) 59 mL 0  . dexlansoprazole (DEXILANT) 60 MG capsule Take 1 capsule (60 mg total) by mouth daily. (Patient not taking: Reported on 04/15/2016) 90 capsule 3  . gabapentin (NEURONTIN) 300 MG capsule Take 1 capsule (300 mg total) by mouth 3 (three) times daily. (Patient not taking: Reported on 04/15/2016) 90 capsule 3  . predniSONE (DELTASONE) 20 MG tablet Take 1 tablet (20 mg total) by mouth daily with breakfast. (Patient not taking: Reported on 04/15/2016) 10 tablet 0  . ranitidine (ZANTAC) 150 MG tablet Take 1 tablet (150 mg total) by mouth 2 (two) times daily. 60 tablet 5   No current facility-administered medications for this visit.    Allergies as of 04/15/2016 - Review Complete 01/14/2016  Allergen Reaction Noted  . Erythromycin  04/24/2015     ROS:  General: Negative for anorexia, weight loss, fever, chills, fatigue, weakness. ENT: Negative for hoarseness, difficulty swallowing , nasal congestion. CV: Negative for chest pain, angina, palpitations, dyspnea on exertion, peripheral edema.  Respiratory: Negative for dyspnea at rest, dyspnea on exertion, cough, sputum, wheezing.  GI: See history of present illness. GU:  Negative for dysuria, hematuria, urinary incontinence, urinary frequency, nocturnal urination.  Endo: Negative for unusual weight change.    Physical Examination:   BP 137/95 mmHg  Pulse 103  Temp(Src) 98.7 F (37.1 C) (Oral)  Ht 5\' 6"  (1.676 m)  Wt 223 lb (101.152 kg)  BMI 36.01 kg/m2  General: Well-nourished, well-developed in no acute distress.  Eyes: No icterus. Conjunctivae pink. Mouth: Oropharyngeal mucosa moist and pink , no lesions erythema or Neuro: Alert and oriented x 3.  Grossly intact. Skin: Warm and dry, no jaundice.   Psych: Alert and cooperative, normal mood and affect.  Labs:    Imaging Studies: Ct Chest W Contrast  03/26/2016  CLINICAL DATA:  Chest pain for 1 year EXAM: CT CHEST WITH CONTRAST TECHNIQUE: Multidetector CT imaging of the chest was performed during intravenous contrast administration. CONTRAST:  71mL ISOVUE-300 IOPAMIDOL (ISOVUE-300) INJECTION 61% COMPARISON:  None. FINDINGS: Mediastinum: Normal heart size. No pericardial effusion identified. Trachea appears patent and is midline. Normal appearance of the esophagus. No mediastinal or hilar adenopathy. Lungs/Pleura: No pleural fluid. No airspace consolidation or atelectasis identified. 3 mm subpleural nodule is identified  in the right upper lobe, image 44 of series 3 peer. 4 mm perifissural nodule is identified within the left lower lobe, image 84 of series 3. No airspace consolidation or atelectasis. Upper Abdomen: There are a few scattered low-density foci within the liver. These measure up to 6 mm and are too small to reliably  characterize. The gallbladder appears normal. No biliary dilatation. Unremarkable appearance of the pancreas and spleen. The visualized portions of the adrenal glands and kidneys are normal. Musculoskeletal: The visualized osseous structures are unremarkable. No aggressive lytic or sclerotic bone lesions identified. IMPRESSION: 1. No acute findings within the chest. 2. Small nodules are noted. No follow-up needed if patient is low-risk (and has no known or suspected primary neoplasm). Non-contrast chest CT can be considered in 12 months if patient is high-risk. This recommendation follows the consensus statement: Guidelines for Management of Incidental Pulmonary Nodules Detected on CT Images:From the Fleischner Society 2017; published online before print (10.1148/radiol.[id number]). 3. Low density foci within the liver are too small to reliably characterize. Electronically Signed   By: Kerby Moors M.D.   On: 03/26/2016 09:46    Assessment and Plan:   Dennis Donaldson is a 50 y.o. y/o male who comes in with chest discomfort. The patient's chest CT did show some nodules which she will follow-up with his oncologist for.  The manometry and chest CT did not show any cause for his chest pain but he does feel better on a PPI although they gave him side effects.  The patient will be started on Zantac 150 mg twice a day to see if this helps his symptoms.   Note: This dictation was prepared with Dragon dictation along with smaller phrase technology. Any transcriptional errors that result from this process are unintentional.

## 2016-05-26 ENCOUNTER — Other Ambulatory Visit: Payer: Self-pay | Admitting: Family Medicine

## 2016-06-04 ENCOUNTER — Other Ambulatory Visit: Payer: Self-pay

## 2016-06-04 NOTE — Telephone Encounter (Signed)
Patient informed and appointment made for next week

## 2016-06-04 NOTE — Telephone Encounter (Signed)
Please schedule appt for refill

## 2016-06-10 ENCOUNTER — Ambulatory Visit: Payer: Managed Care, Other (non HMO) | Admitting: Family Medicine

## 2016-07-23 ENCOUNTER — Other Ambulatory Visit: Payer: Self-pay

## 2016-07-27 ENCOUNTER — Encounter: Payer: Self-pay | Admitting: Gastroenterology

## 2016-07-27 ENCOUNTER — Ambulatory Visit (INDEPENDENT_AMBULATORY_CARE_PROVIDER_SITE_OTHER): Payer: Managed Care, Other (non HMO) | Admitting: Gastroenterology

## 2016-07-27 VITALS — BP 130/83 | HR 105 | Temp 98.3°F | Ht 66.0 in | Wt 226.8 lb

## 2016-07-27 DIAGNOSIS — R1032 Left lower quadrant pain: Secondary | ICD-10-CM

## 2016-07-27 NOTE — Progress Notes (Signed)
   Primary Care Physician: Ashok Norris, MD  Primary Gastroenterologist:  Dr. Lucilla Lame  Chief Complaint  Patient presents with  . LLQ abdominal pain    HPI: Dennis Donaldson is a 51 y.o. male here for left lower quadrant pain.  The patient has been doing well on his Zantac but states he has been having pain in his lower abdomen on the left side right above his hip bone.  He states that the pain is worse when he bends over to lift something.  The pain is not made any better or worse with eating drinking or moving his bowels.  Current Outpatient Prescriptions  Medication Sig Dispense Refill  . albuterol (PROAIR HFA) 108 (90 BASE) MCG/ACT inhaler Inhale 2 puffs into the lungs every 6 (six) hours as needed for wheezing or shortness of breath. 1 Inhaler 3  . beclomethasone (QVAR) 80 MCG/ACT inhaler Inhale 2 puffs into the lungs 2 (two) times daily. 3 Inhaler 1  . desonide (DESOWEN) 0.05 % lotion Apply topically 2 (two) times daily. 59 mL 0  . ranitidine (ZANTAC) 150 MG tablet Take 1 tablet (150 mg total) by mouth 2 (two) times daily. 60 tablet 5   No current facility-administered medications for this visit.     Allergies as of 07/27/2016 - Review Complete 07/27/2016  Allergen Reaction Noted  . Erythromycin  04/24/2015    ROS:  General: Negative for anorexia, weight loss, fever, chills, fatigue, weakness. ENT: Negative for hoarseness, difficulty swallowing , nasal congestion. CV: Negative for chest pain, angina, palpitations, dyspnea on exertion, peripheral edema.  Respiratory: Negative for dyspnea at rest, dyspnea on exertion, cough, sputum, wheezing.  GI: See history of present illness. GU:  Negative for dysuria, hematuria, urinary incontinence, urinary frequency, nocturnal urination.  Endo: Negative for unusual weight change.    Physical Examination:   BP 130/83   Pulse (!) 105   Temp 98.3 F (36.8 C) (Oral)   Ht 5\' 6"  (1.676 m)   Wt 226 lb 12.8 oz (102.9 kg)   BMI 36.61  kg/m   General: Well-nourished, well-developed in no acute distress.  Eyes: No icterus. Conjunctivae pink. Mouth: Oropharyngeal mucosa moist and pink , no lesions erythema or exudate. Lungs: Clear to auscultation bilaterally. Non-labored. Heart: Regular rate and rhythm, no murmurs rubs or gallops.  Abdomen: Bowel sounds are normal, Positive tenderness with one finger palpation while flexing the abdominal wall muscles, nondistended, no hepatosplenomegaly or masses, no abdominal bruits or hernia , no rebound or guarding.   Extremities: No lower extremity edema. No clubbing or deformities. Neuro: Alert and oriented x 3.  Grossly intact. Skin: Warm and dry, no jaundice.   Psych: Alert and cooperative, normal mood and affect.  Labs:    Imaging Studies: No results found.  Assessment and Plan:   Dennis Donaldson is a 50 y.o. y/o male With  Left-sided abdominal pain that is exacerbated with movement and flexing abdominal wall muscles.  The patient has been explained that this is musculoskeletal pain and he was treated as such.  There is no need for any further workup of this pain.   Note: This dictation was prepared with Dragon dictation along with smaller phrase technology. Any transcriptional errors that result from this process are unintentional.

## 2016-07-27 NOTE — Patient Instructions (Signed)
Please contact our office if you have any questions.

## 2016-09-01 ENCOUNTER — Other Ambulatory Visit: Payer: Self-pay | Admitting: Family Medicine

## 2016-09-01 DIAGNOSIS — J4520 Mild intermittent asthma, uncomplicated: Secondary | ICD-10-CM

## 2016-09-20 ENCOUNTER — Other Ambulatory Visit: Payer: Self-pay | Admitting: Gastroenterology

## 2017-08-30 IMAGING — CT CT CHEST W/ CM
2 of 3 series · 17 of 46 positions shown, 19 images · IV contrast (iopamidol)
Comparison: None.

CLINICAL DATA: Chest pain for 1 year

EXAM:
CT CHEST WITH CONTRAST
TECHNIQUE: Multidetector CT imaging of the chest was performed during
intravenous contrast administration.
CONTRAST:  75mL [id number] IOPAMIDOL (3RK8CB-O33) INJECTION 61%

[Series 2: routine chest with · axial · 0.68mm/px · z∈[-518,-248]mm · 14 of 64 slices shown, 16 images]
[im 5/64  soft-tissue]
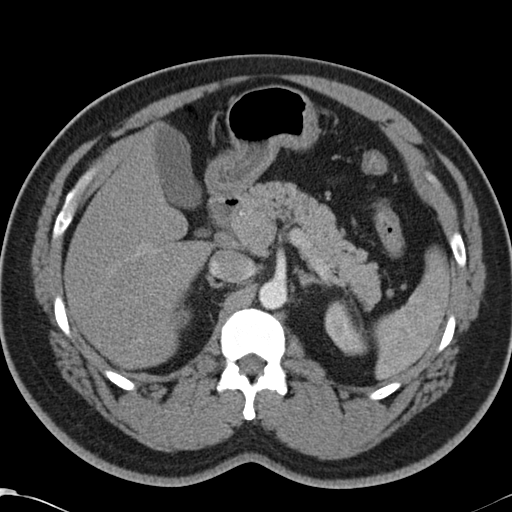
[im 5/64  bone]
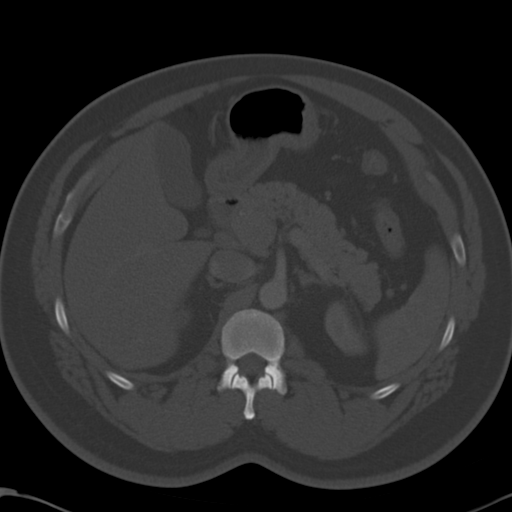
[im 9/64  soft-tissue]
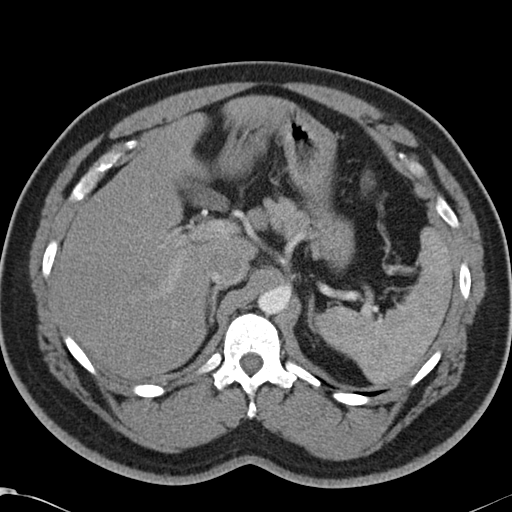
[im 13/64  soft-tissue]
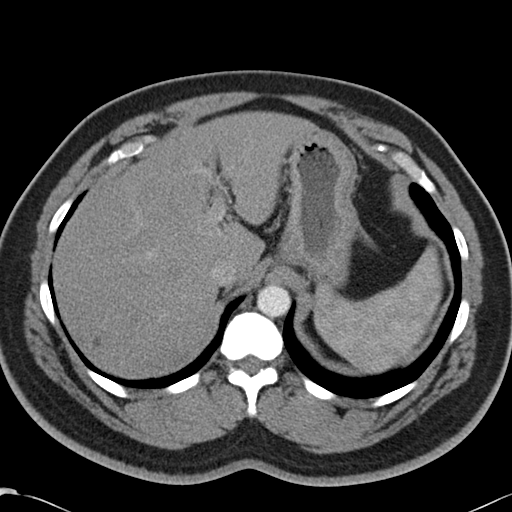
[im 17/64  soft-tissue]
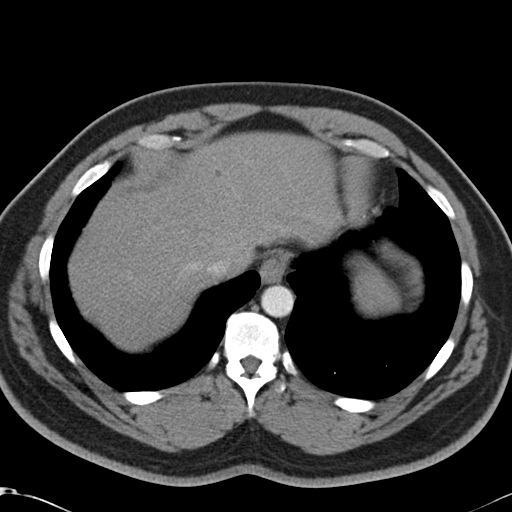
[im 21/64  soft-tissue]
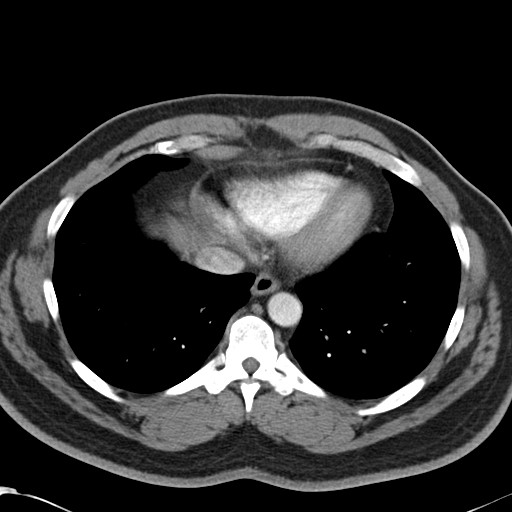
[im 25/64  soft-tissue]
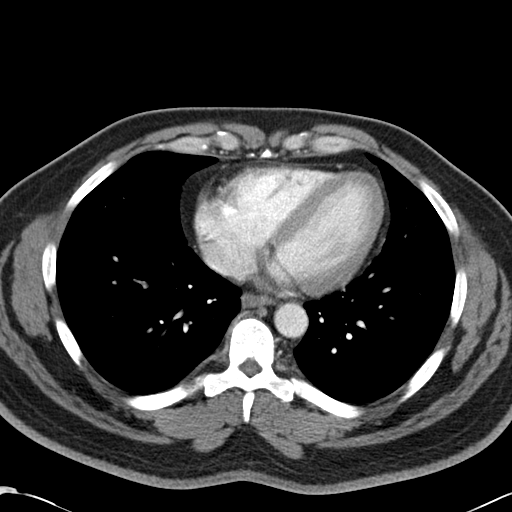
[im 29/64  soft-tissue]
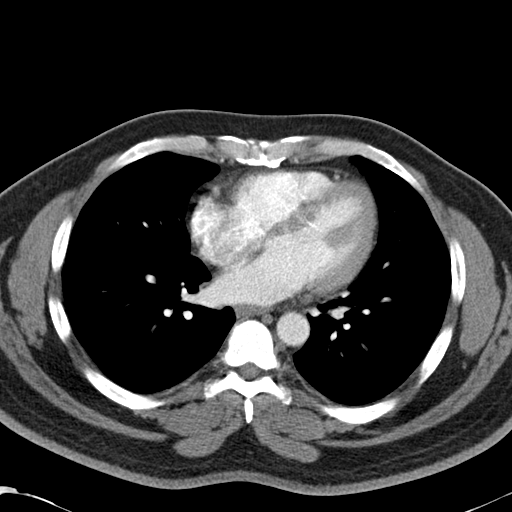
[im 35/64  soft-tissue]
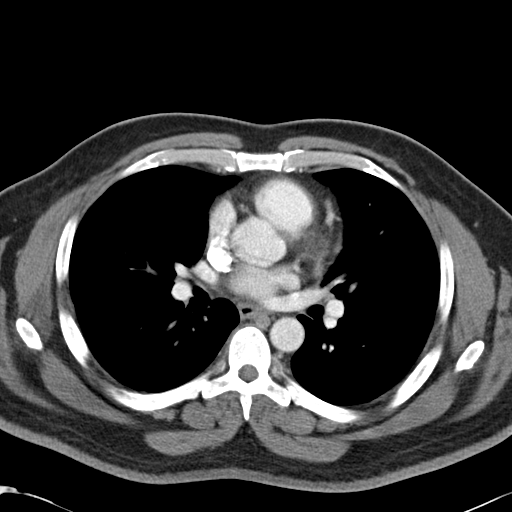
[im 39/64  soft-tissue]
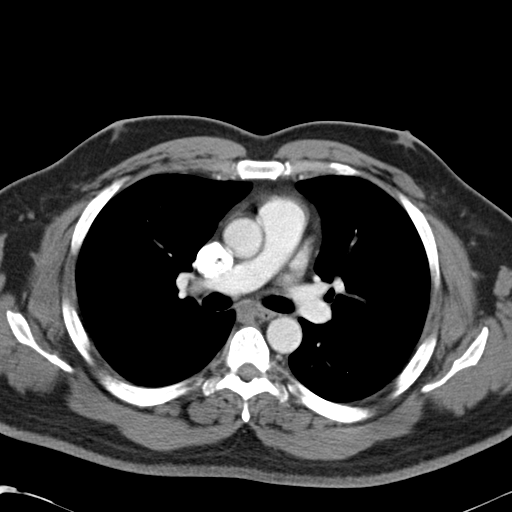
[im 39/64  bone]
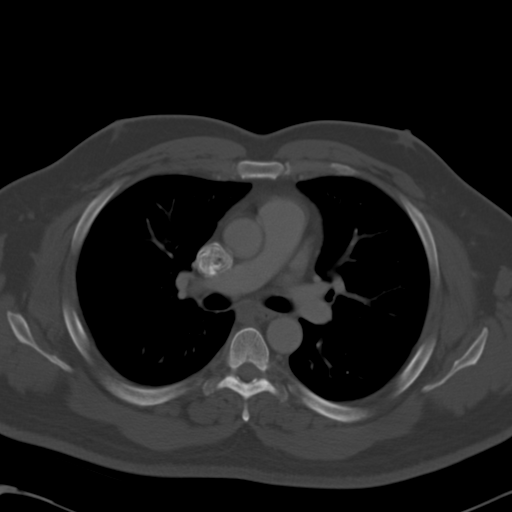
[im 43/64  soft-tissue]
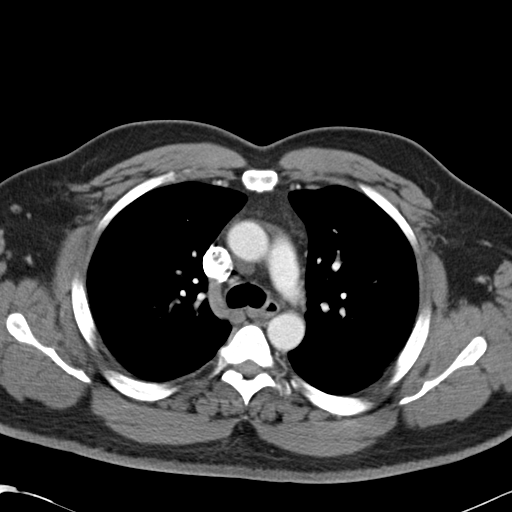
[im 47/64  soft-tissue]
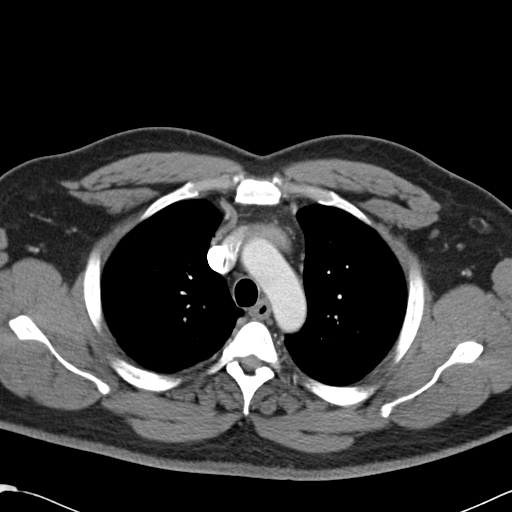
[im 51/64  soft-tissue]
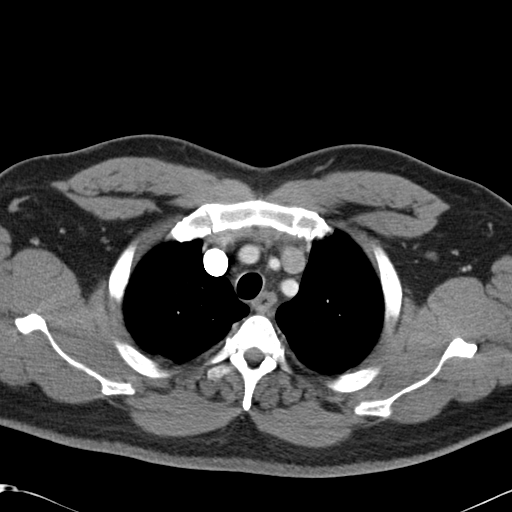
[im 55/64  soft-tissue]
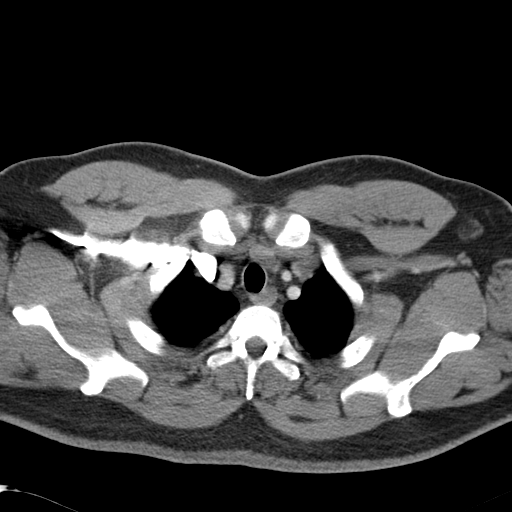
[im 59/64  soft-tissue]
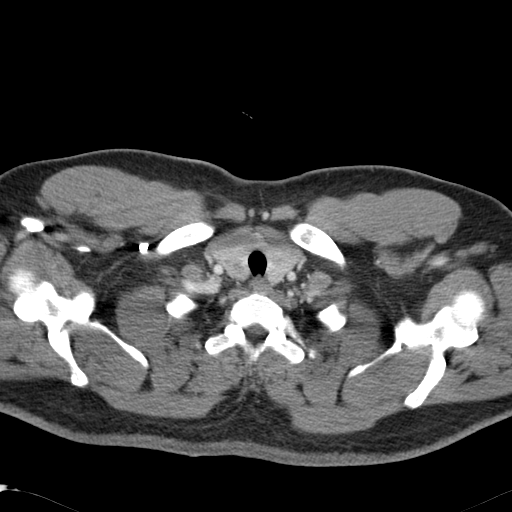

[Series 5: routine chest with cor · coronal · 0.73mm/px · 3 of 122 slices shown]
[im 41/122  soft-tissue]
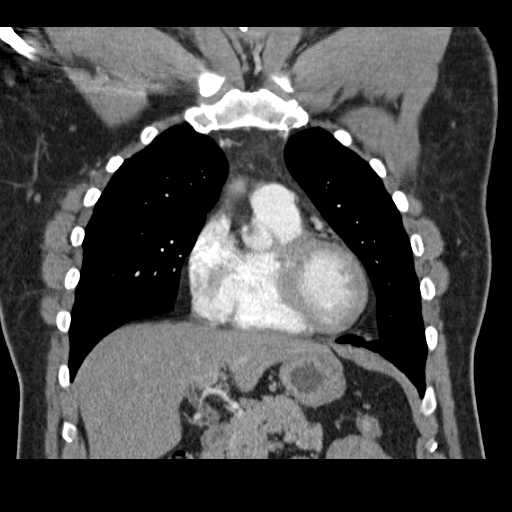
[im 54/122  soft-tissue]
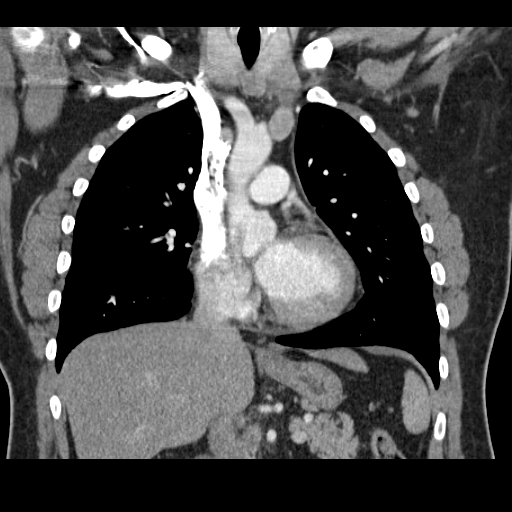
[im 68/122  soft-tissue]
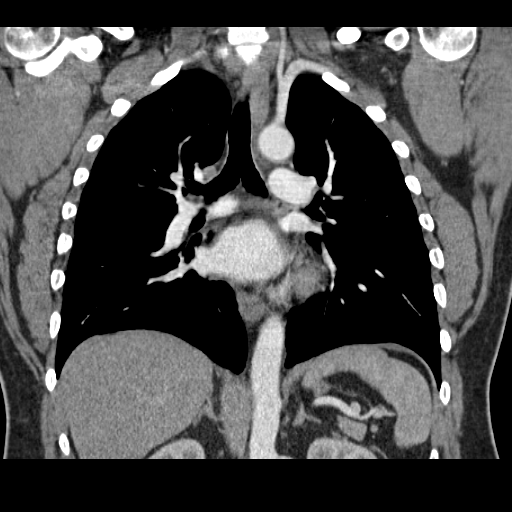

[17 of 46 positions shown; findings below may reference images not displayed]

FINDINGS: Mediastinum: Normal heart size. No pericardial effusion identified.
Trachea appears patent and is midline. Normal appearance of the
esophagus. No mediastinal or hilar adenopathy.

Lungs/Pleura: No pleural fluid. No airspace consolidation or
atelectasis identified. 3 mm subpleural nodule is identified in the
right upper lobe, image 44 of series 3 peer. 4 mm perifissural
nodule is identified within the left lower lobe, image 84 of series
3. No airspace consolidation or atelectasis.

Upper Abdomen: There are a few scattered low-density foci within the
liver. These measure up to 6 mm and are too small to reliably
characterize. The gallbladder appears normal. No biliary dilatation.
Unremarkable appearance of the pancreas and spleen. The visualized
portions of the adrenal glands and kidneys are normal.

Musculoskeletal: The visualized osseous structures are unremarkable.
No aggressive lytic or sclerotic bone lesions identified.
IMPRESSION: 1. No acute findings within the chest.
2. Small nodules are noted. No follow-up needed if patient is
low-risk (and has no known or suspected primary neoplasm).
Non-contrast chest CT can be considered in 12 months if patient is
high-risk. This recommendation follows the consensus statement:
Guidelines for Management of Incidental Pulmonary Nodules Detected
on CT Images:From the [HOSPITAL] 1391; published online
before print (10.1148/[id number]).
3. Low density foci within the liver are too small to reliably
characterize.

## 2017-10-02 ENCOUNTER — Other Ambulatory Visit: Payer: Self-pay | Admitting: Gastroenterology

## 2018-12-25 ENCOUNTER — Other Ambulatory Visit: Payer: Self-pay | Admitting: Gastroenterology
